# Patient Record
Sex: Female | Born: 1970 | Hispanic: No | Marital: Married | Smoking: Never smoker
Health system: Southern US, Community
[De-identification: ages and names within clinical notes are randomized; demographics above are authoritative.]

## PROBLEM LIST (undated history)

## (undated) DIAGNOSIS — K219 Gastro-esophageal reflux disease without esophagitis: Secondary | ICD-10-CM

## (undated) DIAGNOSIS — D509 Iron deficiency anemia, unspecified: Secondary | ICD-10-CM

## (undated) HISTORY — PX: APPENDECTOMY: SHX54

---

## 2002-10-02 ENCOUNTER — Other Ambulatory Visit: Admission: RE | Admit: 2002-10-02 | Discharge: 2002-10-02 | Payer: Self-pay | Admitting: Obstetrics and Gynecology

## 2003-04-12 ENCOUNTER — Inpatient Hospital Stay (HOSPITAL_COMMUNITY): Admission: AD | Admit: 2003-04-12 | Discharge: 2003-04-15 | Payer: Self-pay | Admitting: *Deleted

## 2003-08-13 ENCOUNTER — Inpatient Hospital Stay (HOSPITAL_COMMUNITY): Admission: AD | Admit: 2003-08-13 | Discharge: 2003-08-13 | Payer: Self-pay | Admitting: Obstetrics and Gynecology

## 2003-08-17 ENCOUNTER — Inpatient Hospital Stay (HOSPITAL_COMMUNITY): Admission: AD | Admit: 2003-08-17 | Discharge: 2003-08-17 | Payer: Self-pay | Admitting: Obstetrics and Gynecology

## 2003-11-28 ENCOUNTER — Encounter: Admission: RE | Admit: 2003-11-28 | Discharge: 2003-11-28 | Payer: Self-pay | Admitting: Family Medicine

## 2004-01-07 ENCOUNTER — Ambulatory Visit (HOSPITAL_COMMUNITY): Admission: RE | Admit: 2004-01-07 | Discharge: 2004-01-07 | Payer: Self-pay | Admitting: Obstetrics & Gynecology

## 2004-01-27 ENCOUNTER — Ambulatory Visit (HOSPITAL_COMMUNITY): Admission: RE | Admit: 2004-01-27 | Discharge: 2004-01-27 | Payer: Self-pay | Admitting: Obstetrics & Gynecology

## 2005-03-22 ENCOUNTER — Emergency Department (HOSPITAL_COMMUNITY): Admission: EM | Admit: 2005-03-22 | Discharge: 2005-03-22 | Payer: Self-pay | Admitting: Emergency Medicine

## 2005-03-27 ENCOUNTER — Emergency Department (HOSPITAL_COMMUNITY): Admission: EM | Admit: 2005-03-27 | Discharge: 2005-03-27 | Payer: Self-pay | Admitting: Family Medicine

## 2009-08-13 ENCOUNTER — Encounter: Admission: RE | Admit: 2009-08-13 | Discharge: 2009-08-13 | Payer: Self-pay | Admitting: Otolaryngology

## 2010-01-04 ENCOUNTER — Encounter: Admission: RE | Admit: 2010-01-04 | Discharge: 2010-01-04 | Payer: Self-pay | Admitting: Family Medicine

## 2010-01-04 ENCOUNTER — Encounter (INDEPENDENT_AMBULATORY_CARE_PROVIDER_SITE_OTHER): Payer: Self-pay | Admitting: *Deleted

## 2010-01-18 ENCOUNTER — Encounter (INDEPENDENT_AMBULATORY_CARE_PROVIDER_SITE_OTHER): Payer: Self-pay | Admitting: *Deleted

## 2010-02-05 ENCOUNTER — Ambulatory Visit: Payer: Self-pay | Admitting: Gastroenterology

## 2010-02-05 ENCOUNTER — Encounter (INDEPENDENT_AMBULATORY_CARE_PROVIDER_SITE_OTHER): Payer: Self-pay | Admitting: *Deleted

## 2010-02-05 DIAGNOSIS — R1013 Epigastric pain: Secondary | ICD-10-CM | POA: Insufficient documentation

## 2010-03-23 ENCOUNTER — Emergency Department (HOSPITAL_COMMUNITY)
Admission: EM | Admit: 2010-03-23 | Discharge: 2010-03-23 | Payer: Self-pay | Source: Home / Self Care | Admitting: Emergency Medicine

## 2010-03-29 ENCOUNTER — Ambulatory Visit: Payer: Self-pay | Admitting: Gastroenterology

## 2010-03-30 ENCOUNTER — Telehealth: Payer: Self-pay | Admitting: Gastroenterology

## 2010-03-30 ENCOUNTER — Telehealth (INDEPENDENT_AMBULATORY_CARE_PROVIDER_SITE_OTHER): Payer: Self-pay | Admitting: *Deleted

## 2010-03-31 ENCOUNTER — Ambulatory Visit: Payer: Self-pay | Admitting: Cardiology

## 2010-03-31 ENCOUNTER — Ambulatory Visit: Payer: Self-pay | Admitting: Internal Medicine

## 2010-03-31 ENCOUNTER — Ambulatory Visit: Payer: Self-pay | Admitting: Gastroenterology

## 2010-03-31 ENCOUNTER — Encounter: Payer: Self-pay | Admitting: Physician Assistant

## 2010-03-31 DIAGNOSIS — R634 Abnormal weight loss: Secondary | ICD-10-CM

## 2010-03-31 DIAGNOSIS — K2990 Gastroduodenitis, unspecified, without bleeding: Secondary | ICD-10-CM

## 2010-03-31 DIAGNOSIS — F411 Generalized anxiety disorder: Secondary | ICD-10-CM | POA: Insufficient documentation

## 2010-03-31 DIAGNOSIS — D509 Iron deficiency anemia, unspecified: Secondary | ICD-10-CM

## 2010-03-31 DIAGNOSIS — R1011 Right upper quadrant pain: Secondary | ICD-10-CM

## 2010-03-31 DIAGNOSIS — K2970 Gastritis, unspecified, without bleeding: Secondary | ICD-10-CM | POA: Insufficient documentation

## 2010-03-31 DIAGNOSIS — R110 Nausea: Secondary | ICD-10-CM

## 2010-03-31 DIAGNOSIS — R1031 Right lower quadrant pain: Secondary | ICD-10-CM

## 2010-03-31 LAB — CONVERTED CEMR LAB
ALT: 15 units/L (ref 0–35)
AST: 16 units/L (ref 0–37)
Albumin: 3.8 g/dL (ref 3.5–5.2)
Alkaline Phosphatase: 82 units/L (ref 39–117)
BUN: 14 mg/dL (ref 6–23)
Basophils Absolute: 0.1 10*3/uL (ref 0.0–0.1)
Basophils Relative: 0.7 % (ref 0.0–3.0)
CO2: 27 meq/L (ref 19–32)
Calcium: 9.3 mg/dL (ref 8.4–10.5)
Chloride: 105 meq/L (ref 96–112)
Creatinine, Ser: 0.7 mg/dL (ref 0.4–1.2)
Eosinophils Absolute: 0.3 10*3/uL (ref 0.0–0.7)
Eosinophils Relative: 4.3 % (ref 0.0–5.0)
GFR calc non Af Amer: 91.65 mL/min (ref >60)
Glucose, Bld: 81 mg/dL (ref 70–99)
HCT: 35.7 % — ABNORMAL LOW (ref 36.0–46.0)
Hemoglobin: 12.2 g/dL (ref 12.0–15.0)
Lymphocytes Relative: 26.5 % (ref 12.0–46.0)
Lymphs Abs: 2 10*3/uL (ref 0.7–4.0)
MCHC: 34 g/dL (ref 30.0–36.0)
MCV: 86.8 fL (ref 78.0–100.0)
Monocytes Absolute: 0.6 10*3/uL (ref 0.1–1.0)
Monocytes Relative: 7.3 % (ref 3.0–12.0)
Neutro Abs: 4.6 10*3/uL (ref 1.4–7.7)
Neutrophils Relative %: 61.2 % (ref 43.0–77.0)
Platelets: 370 10*3/uL (ref 150.0–400.0)
Potassium: 4.2 meq/L (ref 3.5–5.1)
RBC: 4.12 M/uL (ref 3.87–5.11)
RDW: 14.9 % — ABNORMAL HIGH (ref 11.5–14.6)
Sodium: 139 meq/L (ref 135–145)
Total Bilirubin: 0.7 mg/dL (ref 0.3–1.2)
Total Protein: 6.8 g/dL (ref 6.0–8.3)
WBC: 7.5 10*3/uL (ref 4.5–10.5)

## 2010-04-01 ENCOUNTER — Encounter: Payer: Self-pay | Admitting: Gastroenterology

## 2010-04-05 ENCOUNTER — Ambulatory Visit (HOSPITAL_COMMUNITY): Admission: RE | Admit: 2010-04-05 | Discharge: 2010-04-05 | Payer: Self-pay | Admitting: Gastroenterology

## 2010-04-05 ENCOUNTER — Ambulatory Visit: Payer: Self-pay | Admitting: Gastroenterology

## 2010-04-05 ENCOUNTER — Telehealth: Payer: Self-pay | Admitting: Physician Assistant

## 2010-04-05 LAB — CONVERTED CEMR LAB
CRP, High Sensitivity: 16.5 — ABNORMAL HIGH (ref 0.00–5.00)
IgA: 163 mg/dL (ref 68–378)

## 2010-04-09 LAB — CONVERTED CEMR LAB: Tissue Transglutaminase Ab, IgA: 4.7 units (ref <20)

## 2010-06-29 NOTE — Miscellaneous (Signed)
Summary: rx  Clinical Lists Changes  Medications: Changed medication from DEXILANT 60 MG CPDR (DEXLANSOPRAZOLE) 1 by mouth once daily to DEXILANT 60 MG CPDR (DEXLANSOPRAZOLE) 1 by mouth once daily 20-30 min before breakfast meal - Signed Rx of DEXILANT 60 MG CPDR (DEXLANSOPRAZOLE) 1 by mouth once daily 20-30 min before breakfast meal;  #30 x 11;  Signed;  Entered by: Rachael Fee MD;  Authorized by: Rachael Fee MD;  Method used: Print then Give to Patient    Prescriptions: DEXILANT 60 MG CPDR (DEXLANSOPRAZOLE) 1 by mouth once daily 20-30 min before breakfast meal  #30 x 11   Entered and Authorized by:   Rachael Fee MD   Signed by:   Rachael Fee MD on 03/29/2010   Method used:   Print then Give to Patient   RxID:   [id number]

## 2010-06-29 NOTE — Miscellaneous (Signed)
Summary: Orders Update/US  Clinical Lists Changes  Orders: Added new Test order of Ultrasound Abdomen (UAS) - Signed 

## 2010-06-29 NOTE — Progress Notes (Signed)
Summary: ON CALL NOTE  Phone Note Call from Patient   Caller: Spouse Summary of Call: husband called tonight at 7pm on call significant language barrier on phone.  Kinzlee has continued abdominal pain.  nausea, no vomiting.  No fevers or tenderness.  I cannot tell if the pain is worse since procedure or same as prior to procedure.  I told her husband if he is very worried about her, she needs to go to ER.  Otherwise, my office will call tomorrow in early AM [phone]) to get her an office apt with PA tomorrow.  She needs CBC, cmet an hour prior to that apt. Initial call taken by: Rachael Fee MD,  March 30, 2010 7:03 PM  Follow-up for Phone Call        pt returned call and spoke with Lady Gary who translated to her that she should be scheduled for an appt today with Amy Esterwood.  She also informed her to have labs 1 hour prior to appt.   Follow-up by: Chales Abrahams CMA Duncan Dull),  March 31, 2010 8:50 AM

## 2010-06-29 NOTE — Letter (Signed)
Summary: Out of Work  [facility]  [address] [place], [place] [address]   Phone: [phone]  Fax: [phone]    March 31, 2010   Employee:  HAEVEN NICKLE    To Whom It May Concern:   For Medical reasons, please excuse the above named employee from work for the following dates:  Start:   03-29-2010  End:   04-04-2010  If you need additional information, please feel free to contact our office.         Sincerely,      Amy Esterwood PA-C

## 2010-06-29 NOTE — Assessment & Plan Note (Signed)
History of Present Illness Visit Type: Initial Consult Primary GI MD: Rob Bunting MD Primary Provider: Maryelizabeth Rowan, MD Requesting Provider: Maryelizabeth Rowan, MD Chief Complaint: reflux and iron deficiency anemia hx of H-Pylori History of Present Illness:     one of our staff acting as interpreter  very pleasant Spanish speaking only 40 year old woman who has had abd discomfort for several months.  Epigastric pain, constant.  Eating makes it worse.  She gets pyrosis daily.  She was taking PPI (omeprazole) but stopped 2 weeks ago. While taking it, she was worse (bloating and worse pyrosis).  Overall her weight is up in th epast year.    She was taking iron, but stopped because it made her nauseas.  Eats chicken every day, but no red meat.   She ibuprofen 2-4 in a week for headaches.    labs in the pastHe does have shown that she is iron deficient. With a ferritin of 8. CBC late July showed hemoglobin 11.2, normocytic, elevated RDW. She was found to be H. pylori breath test positive and was treated with antibiotics this past summer. Other labs including a complete metabolic profile, thyroid testing were all normal.           Current Medications (verified): 1)  Cymbalta 30 Mg Cpep (Duloxetine Hcl) .... One Tablet By Mouth Once Daily 2)  Ferrous Sulfate 325 (65 Fe) Mg Tabs (Ferrous Sulfate) .... One Tablet By Mouth Once Daily 3)  Maalox 600 Mg Chew (Calcium Carbonate Antacid) .... As Needed 4)  Multivitamins   Tabs (Multiple Vitamin) .... One Tablet By Mouth Once Daily 5)  Omeprazole 40 Mg Cpdr (Omeprazole) .... One Capsule By Mouth Once Daily  Allergies (verified): No Known Drug Allergies  Past History:  Past Medical History: anemia Anxiety Arthritis Chronic headaches Depression  Past Surgical History: none  Family History: diabetes  Social History: she is married, she has 3 sons, she works in Education officer, environmental, she does not smoke cigarettes or drink alcohol.  Review  of Systems       Pertinent positive and negative review of systems were noted in the above HPI and GI specific review of systems.  All other review of systems was otherwise negative.   Vital Signs:  Patient profile:   39 year old female Height:      63 inches Weight:      174 pounds BMI:     30.93 Pulse rate:   84 / minute Pulse rhythm:   regular BP sitting:   112 / 72  (left arm)  Vitals Entered By: Milford Cage NCMA (February 05, 2010 3:00 PM)  Physical Exam  Additional Exam:  Constitutional: generally well appearing Psychiatric: alert and oriented times 3 Eyes: extraocular movements intact Mouth: oropharynx moist, no lesions Neck: supple, no lymphadenopathy Cardiovascular: heart regular rate and rythm Lungs: CTA bilaterally Abdomen: soft, non-tender, non-distended, no obvious ascites, no peritoneal signs, normal bowel sounds Extremities: no lower extremity edema bilaterally Skin: no lesions on visible extremities    Impression & Recommendations:  Problem # 1:  Epigastric discomfort, dyspepsia, GERD, iron deficiency perhaps this is recurrent H. pylori infection. Possibly peptic ulcer disease related to NSAIDs. Perhaps just GERD or gastritis. We will plan for EGD at her soonest convenience. I have given her samples of dexilant and asked her to take it once a day. She stopped taking omeprazole as well as iron supplements herself. I told her she should at least take multivitamin once daily containing iron for now.  Patient Instructions: 1)  You will be scheduled to have an upper endoscopy. 2)  Dexilant samples given, take one pill once a day. 3)  A copy of this information will be sent to Dr. Duanne Guess. 4)  Restart the Mutlivitamin once a day. 5)  The medication list was reviewed and reconciled.  All changed / newly prescribed medications were explained.  A complete medication list was provided to the patient / caregiver.  Appended Document: Orders Update/EGD    Clinical  Lists Changes  Problems: Added new problem of ABDOMINAL PAIN, EPIGASTRIC (ICD-789.06) Medications: Added new medication of DEXILANT 60 MG CPDR (DEXLANSOPRAZOLE) 1 by mouth once daily Orders: Added new Test order of EGD (EGD) - Signed

## 2010-06-29 NOTE — Letter (Signed)
Summary: EGD Instructions  [facility]  [address] [place], [place] [address]   Phone: [phone]  Fax: [phone]       Vanessa Richmond    10/18/1970    MRN: [id number]       Procedure Day /Date:03/23/10  TUE     Arrival Time: 1230 pm     Procedure Time:130 pm    Location of Procedure:                    [facility] (4th Floor)    PREPARATION FOR ENDOSCOPY   On 03/23/10  THE DAY OF THE PROCEDURE:  1.   No solid foods, milk or milk products are allowed after midnight the night before your procedure.  2.   Do not drink anything colored red or purple.  Avoid juices with pulp.  No orange juice.  3.  You may drink clear liquids until1130 am, which is 2 hours before your procedure.                                                                                                CLEAR LIQUIDS INCLUDE: Water Jello Ice Popsicles Tea (sugar ok, no milk/cream) Powdered fruit flavored drinks Coffee (sugar ok, no milk/cream) Gatorade Juice: apple, white grape, white cranberry  Lemonade Clear bullion, consomm, broth Carbonated beverages (any kind) Strained chicken noodle soup Hard Candy   MEDICATION INSTRUCTIONS  Unless otherwise instructed, you should take regular prescription medications with a small sip of water as early as possible the morning of your procedure.            OTHER INSTRUCTIONS  You will need a responsible adult at least 42 years of age to accompany you and drive you home.   This person must remain in the waiting room during your procedure.  Wear loose fitting clothing that is easily removed.  Leave jewelry and other valuables at home.  However, you may wish to bring a book to read or an iPod/MP3 player to listen to music as you wait for your procedure to start.  Remove all body piercing jewelry and leave at home.  Total time from sign-in until discharge is approximately 2-3 hours.  You should go home directly after  your procedure and rest.  You can resume normal activities the day after your procedure.  The day of your procedure you should not:   Drive   Make legal decisions   Operate machinery   Drink alcohol   Return to work  You will receive specific instructions about eating, activities and medications before you leave.    The above instructions have been reviewed and explained to me by   _______________________    I fully understand and can verbalize these instructions _____________________________ Date _________

## 2010-06-29 NOTE — Progress Notes (Signed)
Summary: Triage--Epigastric pain after procedure  Phone Note Outgoing Call Call back at [facility] Phone [phone]   Call placed by: Chales Abrahams CMA Duncan Dull),  March 30, 2010 8:39 AM Summary of Call: Pt called the endoscopy unit and complained of epigastric pain.  Dr Christella Hartigan would like to see  the pt today and have labs ( cbc, cmet) and xray ( plain flat-upright) before the appt.    There was no answer at the given number message was left. Initial call taken by: Chales Abrahams CMA Duncan Dull),  March 30, 2010 8:42 AM  Follow-up for Phone Call        left message on machine to call back Chales Abrahams CMA Duncan Dull)  March 30, 2010 1:23 PM   I have tried to call pt several times and have been unable to reach her.  Dr Christella Hartigan do you want me to send her a letter? Follow-up by: Chales Abrahams CMA Duncan Dull),  March 30, 2010 4:48 PM

## 2010-06-29 NOTE — Letter (Signed)
Summary: New Patient letter  [facility]  [address] [place], [place] [address]   Phone: [phone]  Fax: [phone]       01/18/2012 MRN: [id number]  Vanessa Richmond [address] [place], [place]  [address]  Dear Vanessa Richmond,  Welcome to the Gastroenterology Division at [facility].    You are scheduled to see Dr.  Rob Bunting on February 05, 2010 at 3:00pm on the 3rd floor at [facility], [address]  We ask that you try to arrive at our office 15 minutes prior to your appointment time to allow for check-in.  We would like you to complete the enclosed self-administered evaluation form prior to your visit and bring it with you on the day of your appointment.  We will review it with you.  Also, please bring a complete list of all your medications or, if you prefer, bring the medication bottles and we will list them.  Please bring your insurance card so that we may make a copy of it.  If your insurance requires a referral to see a specialist, please bring your referral form from your primary care physician.  Co-payments are due at the time of your visit and may be paid by cash, check or credit card.     Your office visit will consist of a consult with your physician (includes a physical exam), any laboratory testing he/she may order, scheduling of any necessary diagnostic testing (e.g. x-ray, ultrasound, CT-scan), and scheduling of a procedure (e.g. Endoscopy, Colonoscopy) if required.  Please allow enough time on your schedule to allow for any/all of these possibilities.    If you cannot keep your appointment, please call [phone] to cancel or reschedule prior to your appointment date.  This allows [place] the opportunity to schedule an appointment for another patient in need of care.  If you do not cancel or reschedule by 5 p.m. the business day prior to your appointment date, you will be charged a $50.00 late cancellation/no-show fee.    Thank you for  choosing [facility] for your medical needs.  We appreciate the opportunity to care for you.  Please visit [place] at our website  to learn more about our practice.                     Sincerely,                                                             The Gastroenterology Division

## 2010-06-29 NOTE — Procedures (Signed)
Summary: Upper Endoscopy  Patient: [name] Note: All result statuses are Final unless otherwise noted.  Tests: (1) Upper Endoscopy (EGD)   EGD Upper Endoscopy       DONE     [facility]     [address]     [place], [place]  [address]           ENDOSCOPY PROCEDURE REPORT           PATIENT:  Vanessa, Richmond  MR#:  [id number]     BIRTHDATE:  11-Jun-1970, 39 yrs. old  GENDER:  female     ENDOSCOPIST:  Rachael Fee, MD     Referred by:  Maryelizabeth Rowan, M.D.     PROCEDURE DATE:  03/29/2010     PROCEDURE:  EGD with biopsy, [id number]     ASA CLASS:  Class II     INDICATIONS:  GERD, dyspepsia, h/o H. pylori     MEDICATIONS:  Fentanyl 50 mcg IV, Versed 7 mg IV     TOPICAL ANESTHETIC:  Exactacain Spray           DESCRIPTION OF PROCEDURE:   After the risks benefits and     alternatives of the procedure were thoroughly explained, informed     consent was obtained.  The LB GIF-H180 [id number] endoscope was     introduced through the mouth and advanced to the second portion of     the duodenum, without limitations.  The instrument was slowly     withdrawn as the mucosa was fully examined.     <<PROCEDUREIMAGES>>     There was mild, erosive esophagitis at GE junction (see image2).     Mild gastritis was found, biopsied (see image3).  Otherwise the     examination was normal (see image6, image5, and image4).     Retroflexed views revealed no abnormalities.    The scope was then     withdrawn from the patient and the procedure completed.     COMPLICATIONS:  None           ENDOSCOPIC IMPRESSION:     1) Mild, erosive reflux esophagitis     2) Mild gastritis; biopsied taken to check for recurrent H.     pylori.     3) Otherwise normal examination           RECOMMENDATIONS:     New prescription for dexilant sent in.  Take one pill, daily     20-30 min prior to BF meal.     If the biopsies show recurrent H. pylori, she will be started on     the appropriate antibiotics.        ______________________________     Rachael Fee, MD           n.     eSIGNED:   Rachael Fee at 03/29/2010 09:21 AM           Gaspar Bidding, [id number]  Note: An exclamation mark (!) indicates a result that was not dispersed into the flowsheet. Document Creation Date: 03/29/2010 9:21 AM _______________________________________________________________________  (1) Order result status: Final Collection or observation date-time: 03/29/2010 09:13 Requested date-time:  Receipt date-time:  Reported date-time:  Referring Physician:   Ordering Physician: Rob Bunting [phone]) Specimen Source:  Source: Launa Grill Order Number: [id number] Lab site:

## 2010-06-29 NOTE — Assessment & Plan Note (Signed)
Summary: ABD PAIN/YF   History of Present Illness Primary GI MD: Rob Bunting MD Primary Provider: Dr Maryelizabeth Rowan Requesting Provider: Maryelizabeth Rowan, MD Chief Complaint: Patient c/o epgiastric abdominal pain worse after EGD. There is nausea and vomitng as well as abdominal bloating. Patient states that she has had a fever as well. History of Present Illness:   PLEASANT 40 YO HISPANIC FEMALE KNOWN RECENTLY TO DR. Christella Hartigan. SHE SPEAKS VERY LITTLE ENGLISH AND INTERVIEW IS DONE WITH AN INTERPRETOR. SHE HAS C/O ONGOING UPPER ABDOMINAL PAIN OVER THE PAST 3 MONTHS WHICH IS CONSTANT. SHE DESCRIBES IT AS PRESSURE AND THROBBING,WITH RADIATION THRU TO HER BACK. SHE HAS NAUSEA,NO VOMITING. THE PAIN  IS ALWAYS WORSE AFTER EATING,AND SHE SAYS SHE IS AFRAID TO EAT. WEIGHT IS DOWN  A FEW POUNDS. SHE REPORTS INTERMITTENT FEVER AT HOME  ?100.5  RANGE. HER BM'S ALTERNATE SOME, NOTES OCCASIONAL BLOOD IF CONSTIPATED. SHE UNDERWENT EGD ON 10/31 WHICH SHOWED MILD ESOPHAGITIS,MILD GASTRITIS. BX NEGATIVE FOR H.PYLORI. SHE IS TAKING DEXILANT 60 MG DAILY. SHE FEELS HER PAIN IS GETTING WORSE. LABS DONE TODAY ARE PENDING   GI Review of Systems    Reports abdominal pain, belching, bloating, heartburn, loss of appetite, nausea, and  vomiting.      Denies acid reflux, chest pain, dysphagia with liquids, dysphagia with solids, vomiting blood, weight loss, and  weight gain.      Reports change in bowel habits, constipation, diarrhea, and  rectal bleeding.     Denies anal fissure, black tarry stools, diverticulosis, fecal incontinence, heme positive stool, hemorrhoids, irritable bowel syndrome, jaundice, light color stool, liver problems, and  rectal pain.    Current Medications (verified): 1)  Cymbalta 30 Mg Cpep (Duloxetine Hcl) .... One Tablet By Mouth Once Daily 2)  Maalox 600 Mg Chew (Calcium Carbonate Antacid) .... As Needed 3)  Dexilant 60 Mg Cpdr (Dexlansoprazole) .Marland Kitchen.. 1 By Mouth Once Daily 20-30 Min Before  Breakfast Meal  Allergies (verified): No Known Drug Allergies  Past History:  Past Medical History: FE DEFICIENCY ANEMIA Anxiety Arthritis Chronic headaches Depression  Past Surgical History: Tubal Ligation EGD 10/11-MILD ESOPHAGITIS/GASTRITIS (JACOBS)  Family History: diabetes: Mother Family History of Heart Disease: Mother  Social History: Reviewed history from 02/05/2010 and no changes required. she is married, she has 3 sons, she works in Education officer, environmental, she does not smoke cigarettes or drink alcohol.  Review of Systems       The patient complains of fatigue.  The patient denies allergy/sinus, anemia, anxiety-new, arthritis/joint pain, back pain, blood in urine, breast changes/lumps, change in vision, confusion, cough, coughing up blood, depression-new, fainting, headaches-new, hearing problems, heart murmur, heart rhythm changes, itching, menstrual pain, muscle pains/cramps, night sweats, nosebleeds, pregnancy symptoms, shortness of breath, skin rash, sleeping problems, sore throat, swelling of feet/legs, swollen lymph glands, thirst - excessive, urination - excessive, urination changes/pain, urine leakage, vision changes, and voice change.         SEE HPI  Vital Signs:  Patient profile:   40 year old female Height:      63 inches Weight:      172 pounds BMI:     30.58 BSA:     1.82 Temp:     98.7 degrees F Pulse rate:   72 / minute Pulse rhythm:   regular BP sitting:   106 / 68  (left arm)  Vitals Entered By: Lamona Curl CMA Duncan Dull) (March 31, 2010 10:15 AM)  Physical Exam  General:  Well developed, well nourished, no acute  distress. Head:  Normocephalic and atraumatic. Eyes:  PERRLA, no icterus. Neck:  Supple; no masses or thyromegaly. Lungs:  Clear throughout to auscultation. Heart:  Regular rate and rhythm; no murmurs, rubs,  or bruits. Abdomen:  SOFT, TENDER RUQ/RLQ, NO MASS OR HSM, NO GUARDING OR REBOUND,BS+ Rectal:  HEME  NEGATIVE  STOOL. Extremities:  No clubbing, cyanosis, edema or deformities noted. Neurologic:  Alert and  oriented x4;  grossly normal neurologically. Psych:  Alert and cooperative. Normal mood and affect.   Impression & Recommendations:  Problem # 1:  ABDOMINAL PAIN-RUQ (ICD-789.01) Assessment Deteriorated 40 YO HISPANIC FEMALE  WITH 3 MONTH HX OF UPPER ABDOMINAL PAIN,NAUSEA. EGD 03/29/10 WITH MILD GASTRITIS/ESOPHAGITIS-UNCERTAIN THAT THESE FINDINGS EXPLAIN THE DEGREE OF SXS SHE IS HAVING.  ALSO QIUTE TENDER RLQ ON EXAM. R/O GB DISEASE/IBD/OCCULT LESION.  CONTINUE DEXILANT 60 MG DAILY SCHEDULE FOR CT ABD/PELVIS  PT ASKS FOR NOTE FOR WORK-WILL COVER THRU 11/6 TYLENOL  AS NEEDED FOR PAIN LABS FROM TODAY REVIEWED-HGB 12.2,WBC,CMET ALL NORMAL  Problem # 2:  ANEMIA-IRON DEFICIENCY (ICD-280.9) Assessment: Comment Only HEME NEGATIVE, ETILOGY NOT CLEAR,MAY BE MENSTRUAL LOSSES.  Problem # 3:  ANXIETY (ICD-300.00) Assessment: Comment Only  Other Orders: CT Abdomen/Pelvis with Contrast (CT Abd/Pelvis w/con)  Patient Instructions: 1)  Please go to [address] [place]  2)  Directions and brochure provided. 3)  Continue the Dexilant daily. 4)  Take Tylenol 1-2 every 8 hours as needed for pain. 5)  We have given you a note for work. 6)  Copy sent to : Dr. Maryelizabeth Rowan 7)  We will call you with the CT scan results. 8)  The medication list was reviewed and reconciled.  All changed / newly prescribed medications were explained.  A complete medication list was provided to the patient / caregiver.

## 2010-06-29 NOTE — Progress Notes (Signed)
Summary: Patient had ultrasound this am  Phone Note Call from Patient   Call For: Mike Gip, PA Reason for Call: Talk to Nurse Summary of Call: Patient went for ultrasound today and wonders how soon she would hear back on her results and when she should follow up? And wonders if she can stay off work one more week? Initial call taken by: Leanor Kail [facility],  April 05, 2010 9:54 AM  Follow-up for Phone Call        I spoke to the pt in our office at the desk of Ursula Beath who translated for me.  The pt is still having pain.  She says it is constant and at an 8.  She is worried about her job.  She says it is worse for her if she goes to work and they have to send her home.  It is better if she doesn't goes to work and calls in.  We gave her a work note to return this evening 04-05-10.  I left a message for Amy to call me and explained what the pt's complaints were today.  I also sent her to the lab for the tests Dr Christella Hartigan, and Dr Leone Payor agreed she should have.  Follow-up by: Joselyn Glassman,  April 05, 2010 10:21 AM  Additional Follow-up for Phone Call Additional follow up Details #1::        see my note today on her [place] report. Additional Follow-up by: Rachael Fee MD,  April 05, 2010 10:44 AM     Appended Document: Patient had ultrasound this am DEFER TO DR. Christella Hartigan .

## 2010-08-11 LAB — URINALYSIS, ROUTINE W REFLEX MICROSCOPIC
Bilirubin Urine: NEGATIVE
Glucose, UA: NEGATIVE mg/dL
Hgb urine dipstick: NEGATIVE
Ketones, ur: NEGATIVE mg/dL
Nitrite: NEGATIVE
Protein, ur: NEGATIVE mg/dL
Specific Gravity, Urine: 1.01 (ref 1.005–1.030)
Urobilinogen, UA: 0.2 mg/dL (ref 0.0–1.0)
pH: 6 (ref 5.0–8.0)

## 2010-08-11 LAB — CBC
HCT: 34 % — ABNORMAL LOW (ref 36.0–46.0)
Hemoglobin: 11.4 g/dL — ABNORMAL LOW (ref 12.0–15.0)
MCH: 29.2 pg (ref 26.0–34.0)
MCHC: 33.6 g/dL (ref 30.0–36.0)
MCV: 86.8 fL (ref 78.0–100.0)
Platelets: 330 10*3/uL (ref 150–400)
RBC: 3.92 MIL/uL (ref 3.87–5.11)
RDW: 14.3 % (ref 11.5–15.5)
WBC: 8.7 10*3/uL (ref 4.0–10.5)

## 2010-08-11 LAB — COMPREHENSIVE METABOLIC PANEL
ALT: 15 U/L (ref 0–35)
AST: 18 U/L (ref 0–37)
Albumin: 3.8 g/dL (ref 3.5–5.2)
Alkaline Phosphatase: 81 U/L (ref 39–117)
BUN: 13 mg/dL (ref 6–23)
CO2: 25 mEq/L (ref 19–32)
Calcium: 9.1 mg/dL (ref 8.4–10.5)
Chloride: 108 mEq/L (ref 96–112)
Creatinine, Ser: 0.79 mg/dL (ref 0.4–1.2)
GFR calc Af Amer: >60 mL/min (ref >60)
GFR calc non Af Amer: >60 mL/min (ref >60)
Glucose, Bld: 105 mg/dL — ABNORMAL HIGH (ref 70–99)
Potassium: 4 mEq/L (ref 3.5–5.1)
Sodium: 138 mEq/L (ref 135–145)
Total Bilirubin: <0.1 mg/dL — ABNORMAL LOW (ref 0.3–1.2)
Total Protein: 6.8 g/dL (ref 6.0–8.3)

## 2010-08-11 LAB — DIFFERENTIAL
Basophils Absolute: 0.1 10*3/uL (ref 0.0–0.1)
Basophils Relative: 1 % (ref 0–1)
Eosinophils Absolute: 0.2 10*3/uL (ref 0.0–0.7)
Eosinophils Relative: 3 % (ref 0–5)
Lymphocytes Relative: 18 % (ref 12–46)
Lymphs Abs: 1.5 10*3/uL (ref 0.7–4.0)
Monocytes Absolute: 0.5 10*3/uL (ref 0.1–1.0)
Monocytes Relative: 6 % (ref 3–12)
Neutro Abs: 6.3 10*3/uL (ref 1.7–7.7)
Neutrophils Relative %: 73 % (ref 43–77)

## 2010-08-11 LAB — POCT PREGNANCY, URINE: Preg Test, Ur: NEGATIVE

## 2010-08-11 LAB — LIPASE, BLOOD: Lipase: 34 U/L (ref 11–59)

## 2010-08-17 ENCOUNTER — Emergency Department (HOSPITAL_COMMUNITY): Payer: 59

## 2010-08-17 ENCOUNTER — Emergency Department (HOSPITAL_COMMUNITY)
Admission: EM | Admit: 2010-08-17 | Discharge: 2010-08-17 | Disposition: A | Discharge status: Home / Self Care | Payer: 59 | Attending: Emergency Medicine | Admitting: Emergency Medicine

## 2010-08-17 DIAGNOSIS — R5381 Other malaise: Secondary | ICD-10-CM | POA: Insufficient documentation

## 2010-08-17 DIAGNOSIS — R55 Syncope and collapse: Secondary | ICD-10-CM | POA: Insufficient documentation

## 2010-08-17 DIAGNOSIS — R5383 Other fatigue: Secondary | ICD-10-CM | POA: Insufficient documentation

## 2010-08-17 DIAGNOSIS — R059 Cough, unspecified: Secondary | ICD-10-CM | POA: Insufficient documentation

## 2010-08-17 DIAGNOSIS — R05 Cough: Secondary | ICD-10-CM | POA: Insufficient documentation

## 2010-08-19 ENCOUNTER — Emergency Department (HOSPITAL_COMMUNITY): Payer: No Typology Code available for payment source

## 2010-08-19 ENCOUNTER — Emergency Department (HOSPITAL_COMMUNITY)
Admission: EM | Admit: 2010-08-19 | Discharge: 2010-08-20 | Disposition: A | Discharge status: Home / Self Care | Payer: No Typology Code available for payment source | Attending: Emergency Medicine | Admitting: Emergency Medicine

## 2010-08-19 DIAGNOSIS — M542 Cervicalgia: Secondary | ICD-10-CM | POA: Insufficient documentation

## 2010-08-19 DIAGNOSIS — R51 Headache: Secondary | ICD-10-CM | POA: Insufficient documentation

## 2010-08-19 DIAGNOSIS — M545 Low back pain, unspecified: Secondary | ICD-10-CM | POA: Insufficient documentation

## 2010-08-19 DIAGNOSIS — R110 Nausea: Secondary | ICD-10-CM | POA: Insufficient documentation

## 2010-08-19 DIAGNOSIS — H539 Unspecified visual disturbance: Secondary | ICD-10-CM | POA: Insufficient documentation

## 2010-08-19 DIAGNOSIS — R071 Chest pain on breathing: Secondary | ICD-10-CM | POA: Insufficient documentation

## 2010-08-19 DIAGNOSIS — R109 Unspecified abdominal pain: Secondary | ICD-10-CM | POA: Insufficient documentation

## 2010-08-19 DIAGNOSIS — S139XXA Sprain of joints and ligaments of unspecified parts of neck, initial encounter: Secondary | ICD-10-CM | POA: Insufficient documentation

## 2010-08-19 DIAGNOSIS — M546 Pain in thoracic spine: Secondary | ICD-10-CM | POA: Insufficient documentation

## 2010-08-20 ENCOUNTER — Emergency Department (HOSPITAL_COMMUNITY): Payer: No Typology Code available for payment source

## 2010-08-20 LAB — POCT I-STAT, CHEM 8
BUN: 11 mg/dL (ref 6–23)
Calcium, Ion: 1.11 mmol/L — ABNORMAL LOW (ref 1.12–1.32)
Creatinine, Ser: 0.9 mg/dL (ref 0.4–1.2)
TCO2: 24 mmol/L (ref 0–100)

## 2010-08-20 MED ORDER — IOHEXOL 300 MG/ML  SOLN
100.0000 mL | Freq: Once | INTRAMUSCULAR | Status: AC | PRN
  Administered 2010-08-20: 100 mL via INTRAVENOUS

## 2010-10-15 NOTE — H&P (Signed)
NAME:  Vanessa Richmond, Vanessa Richmond                            ACCOUNT NO.:  [id number]   MEDICAL RECORD NO.:  [id number]                   PATIENT TYPE:  AMB   LOCATION:  SDC                                  FACILITY:  [facility]   PHYSICIAN:  Roseanna Rainbow, M.D.         DATE OF BIRTH:  [birth year]   DATE OF ADMISSION:  DATE OF DISCHARGE:                                HISTORY & PHYSICAL   CHIEF COMPLAINT:  The patient is a 40 year old Latino female with subacute  right lower quadrant pain and who desires a sterilization procedure.   HISTORY OF PRESENT ILLNESS:  The patient has a several-month history of  right lower quadrant pain.  Previous workup has included serial pelvic  ultrasounds that were consistent with resolution of a likely right-sided  follicular cyst.   PAST OBSTETRICAL AND GYNECOLOGIC HISTORY:  See above.  She is status post  three NSVDs.   PAST MEDICAL HISTORY:  She gives a history of migraine headaches and a  history of depression and anxiety disorder.   PAST SURGICAL HISTORY:  She denies.   SOCIAL HISTORY:  She is married.  She denies any tobacco, ethanol, or  substance abuse.   FAMILY HISTORY:  Remarkable for myocardial infarction, diabetes mellitus,  and chronic hypertension.   ALLERGIES:  No known drug allergies.   MEDICATIONS:  None.   PHYSICAL EXAMINATION:  VITAL SIGNS:  Blood pressure 105/56, pulse 66,  temperature 98.3, weight 156 pounds.  GENERAL:  A well-developed, well-nourished Latino female in no apparent  distress.  NECK:  Supple.  CHEST:  Lungs clear to auscultation bilaterally.  CARDIAC:  Regular rate and rhythm.  ABDOMEN:  Soft, nontender, no organomegaly.  PELVIC:  BUS normal.  On speculum exam, the vagina is clean.  On bimanual  exam the uterus is anteverted, normal size, nontender.  The adnexa are  nonpalpable, nontender bilaterally.   ASSESSMENT:  1. Multipara, desires sterilization procedure.  2. Subacute right lower quadrant pain.   PLAN:   Laparoscopic bilateral tubal ligation with possible peritoneal  biopsies.  The risks, benefits, and alternative forms of management were  reviewed with the patient, including but not limited to a failure rate of  approximately four to eight per 1000 cases with the subsequent increased  risk of an ectopic pregnancy.                                               Roseanna Rainbow, M.D.    Vanessa Richmond  D:  01/11/2004  T:  01/11/2004  Job:  [id number]

## 2010-10-15 NOTE — Op Note (Signed)
NAME:  Vanessa Richmond, [name]                            ACCOUNT NO.:  [id number]   MEDICAL RECORD NO.:  [id number]                   PATIENT TYPE:  AMB   LOCATION:  SDC                                  FACILITY:  [facility]   PHYSICIAN:  Roseanna Rainbow, M.D.         DATE OF BIRTH:  1970/07/25   DATE OF PROCEDURE:  01/27/2004  DATE OF DISCHARGE:                                 OPERATIVE REPORT   PREOPERATIVE DIAGNOSES:  1.  Multiparity, desires sterilization procedure.  2.  Chronic pelvic pain.   POSTOPERATIVE DIAGNOSES:  1.  Multiparity, desires sterilization procedure.  2.  Chronic pelvic pain.   PROCEDURE:  Laparoscopic bilateral tubal ligation with bipolar cautery.   SURGEON:  Roseanna Rainbow, M.D.   ANESTHESIA:  General endotracheal.   ESTIMATED BLOOD LOSS:  Less than 50 mL.   COMPLICATIONS:  None.   FINDINGS:  A survey of the abdominopelvic viscera peritoneal surfaces was  essentially normal.  There were filmy adhesions involving the cecum to the  abdominal wall.   PROCEDURE:  The patient was taken to the operating room.  General anesthesia  was induced without difficulty.  She was placed in the dorsal lithotomy  position and prepped and draped in the usual sterile fashion.  A speculum  was placed in the vagina.  The anterior lip of the cervix was grasped with a  single-tooth tenaculum.  The Hulka with manipulator was then advanced into  the uterus and secured to the anterior lip of the cervix as a means to  manipulate the uterus.  The single-tooth tenaculum and speculum were then  removed.  An infraumbilical skin incision was then made with a scalpel.  The  Veress needle was then introduced into the peritoneal cavity at a 45 degree  angle while tenting up the anterior abdominal wall.  Intra-abdominal  placement was confirmed with an appropriate CO2 pressure level as well as a  saline drop test.  The abdomen was then insufflated with CO2 gas.  An 11 mm  trocar and sleeve  were then advanced into the abdomen, again where intra-  abdominal placement was confirmed by the laparoscope.  A survey of the  abdominopelvic viscera revealed the above findings.  The midisthmic portion  of the fallopian tubes were then cauterized contiguously bilaterally.  With  each application, the Ohm meter was noted to go to 0.  All the instruments  were then removed from the abdomen.  The skin was reapproximated with 3-0  Monocryl and a skin  adhesive.  The Hulka manipulator was removed from the cervix with minimal  bleeding noted.  At the close of the procedure the instrument and pad counts  were said to be correct x2.  The patient was taken to the PACU awake, in  stable condition.  Roseanna Rainbow, M.D.    Judee Clara  D:  01/27/2004  T:  01/27/2004  Job:  [id number]

## 2010-11-24 ENCOUNTER — Emergency Department (HOSPITAL_COMMUNITY): Payer: Worker's Compensation

## 2010-11-24 ENCOUNTER — Emergency Department (HOSPITAL_COMMUNITY)
Admission: EM | Admit: 2010-11-24 | Discharge: 2010-11-24 | Disposition: A | Discharge status: Home / Self Care | Payer: Worker's Compensation | Attending: Emergency Medicine | Admitting: Emergency Medicine

## 2010-11-24 DIAGNOSIS — F329 Major depressive disorder, single episode, unspecified: Secondary | ICD-10-CM | POA: Insufficient documentation

## 2010-11-24 DIAGNOSIS — Y9269 Other specified industrial and construction area as the place of occurrence of the external cause: Secondary | ICD-10-CM | POA: Insufficient documentation

## 2010-11-24 DIAGNOSIS — T5991XA Toxic effect of unspecified gases, fumes and vapors, accidental (unintentional), initial encounter: Secondary | ICD-10-CM | POA: Insufficient documentation

## 2010-11-24 DIAGNOSIS — K2970 Gastritis, unspecified, without bleeding: Secondary | ICD-10-CM | POA: Insufficient documentation

## 2010-11-24 DIAGNOSIS — F3289 Other specified depressive episodes: Secondary | ICD-10-CM | POA: Insufficient documentation

## 2010-11-24 DIAGNOSIS — T59891A Toxic effect of other specified gases, fumes and vapors, accidental (unintentional), initial encounter: Secondary | ICD-10-CM | POA: Insufficient documentation

## 2011-04-04 ENCOUNTER — Other Ambulatory Visit: Payer: Self-pay | Admitting: Gastroenterology

## 2011-06-07 ENCOUNTER — Ambulatory Visit (HOSPITAL_COMMUNITY)
Admission: RE | Admit: 2011-06-07 | Discharge: 2011-06-07 | Disposition: A | Discharge status: Home / Self Care | Payer: Worker's Compensation | Source: Ambulatory Visit | Attending: Preventative Medicine | Admitting: Preventative Medicine

## 2011-06-07 DIAGNOSIS — M546 Pain in thoracic spine: Secondary | ICD-10-CM | POA: Insufficient documentation

## 2011-06-07 DIAGNOSIS — R262 Difficulty in walking, not elsewhere classified: Secondary | ICD-10-CM | POA: Insufficient documentation

## 2011-06-07 DIAGNOSIS — M6281 Muscle weakness (generalized): Secondary | ICD-10-CM | POA: Insufficient documentation

## 2011-06-07 DIAGNOSIS — M545 Low back pain, unspecified: Secondary | ICD-10-CM | POA: Insufficient documentation

## 2011-06-07 DIAGNOSIS — M2560 Stiffness of unspecified joint, not elsewhere classified: Secondary | ICD-10-CM | POA: Insufficient documentation

## 2011-06-07 DIAGNOSIS — IMO0001 Reserved for inherently not codable concepts without codable children: Secondary | ICD-10-CM | POA: Insufficient documentation

## 2011-06-07 NOTE — Progress Notes (Signed)
Physical Therapy Evaluation  Patient Details  Name: CHIDINMA CLITES MRN: [id number] Date of Birth: Mar 27, 1971  Today's Date: 06/07/2011 Time: 1030-1125 Time Calculation (min): 55 min  Visit#: 1  of 3   Re-eval:   Assessment Diagnosis: back contusion Next MD Visit: 06/09/11 Prior Therapy: none  Past Medical History: No past medical history on file. Past Surgical History: No past surgical history on file.  Subjective Symptoms/Limitations Symptoms: Ms. Morris states that she was at work when she fell backwards .  She states that she is having increased pain from her thoracic area to her low back B.   How long can you sit comfortably?: The patient states that if she sits she has increased pain almost immediately. How long can you stand comfortably?: Pt states that standing is better but she feels she needs to sit after 15-20 minute. How long can you walk comfortably?: Pt states that she has increased pain after walking for ten minutes. Pain Assessment Currently in Pain?: Yes Pain Score:   8 Pain Location: Back Pain Orientation: Mid;Lower;Medial;Lateral;Right;Left Pain Type: Acute pain Pain Onset: In the past 7 days Pain Frequency: Constant    Prior Functioning  Prior Function Vocation: Full time employment Vocation Requirements: standing uses pressure washer; bending forward and reaching up   Assessment RUE Assessment RUE Assessment: Exceptions to [facility] but slow and painful with all motion) LUE AROM (degrees) Overall AROM Left Upper Extremity:  (wnl but slow and painful) Lumbar AROM Lumbar Flexion: decreased 40% with reps causing increased pain Lumbar Extension: decreased 50% reps increse pain Lumbar - Right Side Bend: decreased 20% Lumbar - Left Side Bend: decreased 20% Lumbar - Right Rotation: decreased 25% Lumbar - Left Rotation: wnl  Exercise/Treatments    Stretches Active Hamstring Stretch: 3 reps;30 seconds Single Knee to Chest Stretch: 3 reps;30  seconds Lower Trunk Rotation: 5 reps ITB Stretch:  (Supine Shld flex B; standing Shoulder Abduction B x 5)   Modalities Modalities: Moist Heat;Electrical Stimulation Moist Heat Therapy Number Minutes Moist Heat: 15 Minutes Moist Heat Location:  (Thoracic/lumbar area B) Electrical Stimulation Electrical Stimulation Location: IFES lower thoracic/lumbar area B Electrical Stimulation Parameters: 14 Electrical Stimulation Goals: Pain  Physical Therapy Assessment and Plan PT Assessment and Plan Clinical Impression Statement: Pt with mm contusion/strain who will benefit from skilled therapy to return pt to previous functional level Rehab Potential: Good PT Frequency: Min 3X/week PT Duration:  (one week per MD) PT Treatment/Interventions: Therapeutic activities;Other (comment) (modalities as needed for pain) PT Plan: begin functional squat; mad cat; standing SB exercises next treatment.    Goals Home Exercise Program Pt will Perform Home Exercise Program: Independently PT Short Term Goals Time to Complete Short Term Goals:  (3 days) PT Short Term Goal 1: Pt pain to be decreased by 50% PT Short Term Goal 2: Pt to have full shoulder flex without pain, Pt to be able to squat without pain  Problem List Patient Active Problem List  Diagnoses  . ANEMIA-IRON DEFICIENCY  . ANXIETY  . GASTRITIS  . WEIGHT LOSS  . NAUSEA ALONE  . ABDOMINAL PAIN-RUQ  . ABDOMINAL PAIN-RLQ  . ABDOMINAL PAIN, EPIGASTRIC  . Stiffness of joints, not elsewhere classified, multiple sites    PT - End of Session Activity Tolerance: Patient tolerated treatment well General Behavior During Session: [facility] for tasks performed Cognition: [facility] for tasks performed   RUSSELL,CINDY 06/07/2011, 12:12 PM  Physician Documentation Your signature is required to indicate approval of the treatment plan as stated above.  Please sign and either send electronically or make a copy of this report for your files and return this  physician signed original.   Please mark one 1.__approve of plan  2. ___approve of plan with the following conditions.   ______________________________                                                          _____________________ Physician Signature                                                                                                             Date

## 2011-06-07 NOTE — Patient Instructions (Addendum)
HEP

## 2011-06-08 ENCOUNTER — Ambulatory Visit (HOSPITAL_COMMUNITY)
Admission: RE | Admit: 2011-06-08 | Discharge: 2011-06-08 | Disposition: A | Discharge status: Home / Self Care | Payer: Worker's Compensation | Source: Ambulatory Visit | Attending: Preventative Medicine | Admitting: Preventative Medicine

## 2011-06-08 NOTE — Progress Notes (Signed)
Physical Therapy Treatment Patient Details  Name: Vanessa Richmond MRN: [id number] Date of Birth: Feb 18, 1971  Today's Date: 06/08/2011 Time: 1450-1550 Time Calculation (min): 60 min Visit#: 2  of 3   Charges: Therex x 34' MHP w/IFES x 15'  Subjective: Symptoms/Limitations Symptoms: Pt reports HEP compliance. Pain Assessment Currently in Pain?: Yes Pain Score:   7 Pain Location: Back Pain Orientation: Mid;Lower   Exercise/Treatments  Stretches Active Hamstring Stretch: 3 reps;30 seconds Single Knee to Chest Stretch: 3 reps;30 seconds Lower Trunk Rotation: 5 reps Stability Functional Squats: 10 reps Lifting: Limitations Lifting Limitations: Seated pelivc tilts A/P x 10  Modalities Modalities: Moist Heat;Electrical Stimulation Moist Heat Therapy Number Minutes Moist Heat: 15 Minutes Moist Heat Location: Other (comment) (Thoracic/lumbar area B w/IFES) Electrical Stimulation Electrical Stimulation Location: B lower thoracic/lumbar area Electrical Stimulation Parameters: IFES L 15 Electrical Stimulation Goals: Pain  Physical Therapy Assessment and Plan PT Assessment and Plan Clinical Impression Statement: Pt presents with very slow guarded movements. Pt with facial grimace with any movements of the trunk. Attempted quadruped pelvic tilts but pt unable to tolerate quadruped position. Pelvic tilts completed in sitting position. Pt requires multimodal cueing to facilitate mroe anterior pelvic til with functional squats. Pt reports pain decrease to 6/10 at end of session. PT Plan: Continue to progress per PT POC. Incorporate side bending exercises next session.     Problem List Patient Active Problem List  Diagnoses  . ANEMIA-IRON DEFICIENCY  . ANXIETY  . GASTRITIS  . WEIGHT LOSS  . NAUSEA ALONE  . ABDOMINAL PAIN-RUQ  . ABDOMINAL PAIN-RLQ  . ABDOMINAL PAIN, EPIGASTRIC  . Stiffness of joints, not elsewhere classified, multiple sites    PT - End of Session Activity  Tolerance: Patient tolerated treatment well General Behavior During Session: [facility] for tasks performed Cognition: [facility] for tasks performed  Antonieta Iba 06/08/2011, 4:01 PM

## 2011-06-09 ENCOUNTER — Ambulatory Visit (HOSPITAL_COMMUNITY)
Admission: RE | Admit: 2011-06-09 | Discharge: 2011-06-09 | Disposition: A | Discharge status: Home / Self Care | Payer: Worker's Compensation | Source: Ambulatory Visit | Attending: Preventative Medicine | Admitting: Preventative Medicine

## 2011-06-09 NOTE — Progress Notes (Signed)
Physical Therapy Treatment Patient Details  Name: Vanessa Richmond MRN: [id number] Date of Birth: 23-Oct-1970  Today's Date: 06/09/2011 Time: 4098-1191 Time Calculation (min): 28 min Visit#: 3  of 3   Re-eval:  N/A Charges:  therex 25'    Subjective: Symptoms/Limitations Symptoms: Pt. was 1 hour late for her appt.  Pt. states she has not been doing her exercises.  Pt states the machine/heat makes her pain higher (explained she voiced her pain was lower after receiving last visit). Pain Assessment Currently in Pain?: Yes Pain Score:   8 Pain Location: Back Pain Radiating Towards: Points from upper thoracic all way to belt line   Exercise/Treatments Stretches Active Hamstring Stretch: 3 reps;30 seconds Single Knee to Chest Stretch: 3 reps;30 seconds Lower Trunk Rotation: 5 reps Stability Bridge: 10 reps Straight Leg Raise: 10 reps   Physical Therapy Assessment and Plan PT Assessment and Plan Clinical Impression Statement: Pt. with constant facial grimmacing througout treatment.  Pt. took extended time to complete therex due to resting/pain.  Held modalities as pt. stated the IFES and heat did not help.  Pt. returns to MD tomorrow. PT Plan: Await further orders from MD.     Problem List Patient Active Problem List  Diagnoses  . ANEMIA-IRON DEFICIENCY  . ANXIETY  . GASTRITIS  . WEIGHT LOSS  . NAUSEA ALONE  . ABDOMINAL PAIN-RUQ  . ABDOMINAL PAIN-RLQ  . ABDOMINAL PAIN, EPIGASTRIC  . Stiffness of joints, not elsewhere classified, multiple sites    PT - End of Session Activity Tolerance: Patient limited by pain General Behavior During Session: [facility] for tasks performed Cognition: [facility] for tasks performed  Amy B. Bascom Levels, PTA 06/09/2011, 5:00 PM

## 2012-10-23 ENCOUNTER — Encounter (HOSPITAL_COMMUNITY): Payer: Self-pay | Admitting: Adult Health

## 2012-10-23 DIAGNOSIS — Z3202 Encounter for pregnancy test, result negative: Secondary | ICD-10-CM | POA: Insufficient documentation

## 2012-10-23 DIAGNOSIS — R110 Nausea: Secondary | ICD-10-CM | POA: Insufficient documentation

## 2012-10-23 DIAGNOSIS — M549 Dorsalgia, unspecified: Secondary | ICD-10-CM | POA: Insufficient documentation

## 2012-10-23 LAB — CBC WITH DIFFERENTIAL/PLATELET
Basophils Absolute: 0 10*3/uL (ref 0.0–0.1)
Basophils Relative: 0 % (ref 0–1)
Hemoglobin: 10.4 g/dL — ABNORMAL LOW (ref 12.0–15.0)
MCHC: 32 g/dL (ref 30.0–36.0)
Monocytes Relative: 7 % (ref 3–12)
Neutro Abs: 4.3 10*3/uL (ref 1.7–7.7)
Neutrophils Relative %: 58 % (ref 43–77)
Platelets: 396 10*3/uL (ref 150–400)
RDW: 19 % — ABNORMAL HIGH (ref 11.5–15.5)

## 2012-10-23 LAB — COMPREHENSIVE METABOLIC PANEL
ALT: 20 U/L (ref 0–35)
CO2: 25 mEq/L (ref 19–32)
Calcium: 9 mg/dL (ref 8.4–10.5)
Creatinine, Ser: 0.86 mg/dL (ref 0.50–1.10)
GFR calc Af Amer: >90 mL/min (ref >90)
GFR calc non Af Amer: 81 mL/min — ABNORMAL LOW (ref >90)
Glucose, Bld: 130 mg/dL — ABNORMAL HIGH (ref 70–99)
Sodium: 138 mEq/L (ref 135–145)

## 2012-10-23 LAB — URINALYSIS, ROUTINE W REFLEX MICROSCOPIC
Leukocytes, UA: NEGATIVE
Nitrite: NEGATIVE
Specific Gravity, Urine: 1.016 (ref 1.005–1.030)
Urobilinogen, UA: 0.2 mg/dL (ref 0.0–1.0)

## 2012-10-23 LAB — POCT PREGNANCY, URINE: Preg Test, Ur: NEGATIVE

## 2012-10-23 MED ORDER — FENTANYL CITRATE 0.05 MG/ML IJ SOLN
50.0000 ug | Freq: Once | INTRAMUSCULAR | Status: AC
  Administered 2012-10-23: 50 ug via INTRAVENOUS

## 2012-10-23 MED ORDER — FENTANYL CITRATE 0.05 MG/ML IJ SOLN
INTRAMUSCULAR | Status: AC
  Administered 2012-10-23: 50 ug via INTRAVENOUS
  Filled 2012-10-23: qty 2

## 2012-10-23 NOTE — ED Notes (Signed)
Presents with right sided flank pain that began 2 days ago and radiates to upper right quadrant associated with headache and painful urination. Pain is described as squeezing

## 2012-10-23 NOTE — ED Notes (Signed)
NURSE FIRST ROUNDS : RECEIVED FENTANYL 50 MCG IV AT TRIAGE FOR RIGHT FLANK PAIN WITH RELIEF , RESPIRATIONS UNLABORED , NURSE EXPLAINED DELAY , WAIT TIME AND PROCESS.

## 2012-10-24 ENCOUNTER — Encounter (HOSPITAL_COMMUNITY): Payer: Self-pay | Admitting: Radiology

## 2012-10-24 ENCOUNTER — Emergency Department (HOSPITAL_COMMUNITY): Payer: Medicaid Other

## 2012-10-24 ENCOUNTER — Emergency Department (HOSPITAL_COMMUNITY)
Admission: EM | Admit: 2012-10-24 | Discharge: 2012-10-24 | Disposition: A | Discharge status: Home / Self Care | Payer: 59 | Attending: Emergency Medicine | Admitting: Emergency Medicine

## 2012-10-24 DIAGNOSIS — M549 Dorsalgia, unspecified: Secondary | ICD-10-CM

## 2012-10-24 MED ORDER — HYDROMORPHONE HCL PF 1 MG/ML IJ SOLN
1.0000 mg | Freq: Once | INTRAMUSCULAR | Status: AC
  Administered 2012-10-24: 1 mg via INTRAVENOUS
  Filled 2012-10-24: qty 1

## 2012-10-24 MED ORDER — ONDANSETRON HCL 4 MG/2ML IJ SOLN
4.0000 mg | Freq: Once | INTRAMUSCULAR | Status: AC
  Administered 2012-10-24: 4 mg via INTRAVENOUS
  Filled 2012-10-24: qty 2

## 2012-10-24 MED ORDER — HYDROCODONE-ACETAMINOPHEN 5-325 MG PO TABS: 2.0000 | ORAL_TABLET | ORAL | Status: DC | PRN

## 2012-10-24 MED ORDER — IBUPROFEN 800 MG PO TABS: 800.0000 mg | ORAL_TABLET | Freq: Three times a day (TID) | ORAL | Status: DC

## 2012-10-24 MED ORDER — CYCLOBENZAPRINE HCL 10 MG PO TABS: 10.0000 mg | ORAL_TABLET | Freq: Two times a day (BID) | ORAL | Status: DC | PRN

## 2012-10-24 NOTE — ED Notes (Addendum)
Pt reports onset of rt flank pain that has gotten progressively worse x2 days - pt denies any fever or urinary symptoms at present.

## 2012-10-24 NOTE — ED Provider Notes (Signed)
History     CSN: [id number]  Arrival date & time 10/23/12  2054   First MD Initiated Contact with Patient 10/24/12 0054      Chief Complaint  Patient presents with  . Flank Pain    (Consider location/radiation/quality/duration/timing/severity/associated sxs/prior treatment) HPI HX per PT - R flank pain x 2 weeks, worse last night, slight nausea, no vomiting, no diarrhea, not affected by eating, she does not work, denies any heavy lifting, no F/C, no h/o same, not taking any medication for this at home. Pain severe tonight.   History reviewed. No pertinent past medical history.  No past surgical history on file.  No family history on file.  History  Substance Use Topics  . Smoking status: Not on file  . Smokeless tobacco: Not on file  . Alcohol Use: Not on file    OB History   Grav Para Term Preterm Abortions TAB SAB Ect Mult Living                  Review of Systems  Constitutional: Negative for fever and chills.  HENT: Negative for neck pain and neck stiffness.   Eyes: Negative for pain.  Respiratory: Negative for shortness of breath.   Cardiovascular: Negative for chest pain.  Gastrointestinal: Negative for blood in stool.  Genitourinary: Positive for flank pain. Negative for dysuria.  Musculoskeletal: Negative for back pain.  Skin: Negative for rash.  Neurological: Negative for headaches.  All other systems reviewed and are negative.    Allergies  Review of patient's allergies indicates no known allergies.  Home Medications  No current outpatient prescriptions on file.  BP 124/76  Pulse 76  Temp(Src) 98.8 F (37.1 C) (Oral)  Resp 18  SpO2 98%  Physical Exam  Constitutional: She is oriented to person, place, and time. She appears well-developed and well-nourished.  HENT:  Head: Normocephalic and atraumatic.  Eyes: EOM are normal. Pupils are equal, round, and reactive to light. No scleral icterus.  Neck: Neck supple.  Cardiovascular: Normal  rate, regular rhythm and intact distal pulses.   Pulmonary/Chest: Effort normal and breath sounds normal. No respiratory distress. She exhibits no tenderness.  Abdominal: Soft. Bowel sounds are normal. She exhibits no distension. There is no tenderness. There is no rebound and no guarding.  No CVAT, neg Murphys sign  Musculoskeletal: Normal range of motion. She exhibits no edema and no tenderness.  No midline lumbar or thoracic tenderness  Neurological: She is alert and oriented to person, place, and time. No cranial nerve deficit.  Skin: Skin is warm and dry.    ED Course  Procedures (including critical care time)  Results for orders placed during the hospital encounter of 10/24/12  LIPASE, BLOOD      Result Value Range   Lipase 68 (*) 11 - 59 U/L  COMPREHENSIVE METABOLIC PANEL      Result Value Range   Sodium 138  135 - 145 mEq/L   Potassium 3.9  3.5 - 5.1 mEq/L   Chloride 103  96 - 112 mEq/L   CO2 25  19 - 32 mEq/L   Glucose, Bld 130 (*) 70 - 99 mg/dL   BUN 17  6 - 23 mg/dL   Creatinine, Ser 7.82  0.50 - 1.10 mg/dL   Calcium 9.0  8.4 - 95.6 mg/dL   Total Protein 7.0  6.0 - 8.3 g/dL   Albumin 3.7  3.5 - 5.2 g/dL   AST 20  0 - 37 U/L  ALT 20  0 - 35 U/L   Alkaline Phosphatase 86  39 - 117 U/L   Total Bilirubin 0.1 (*) 0.3 - 1.2 mg/dL   GFR calc non Af Amer 81 (*) >90 mL/min   GFR calc Af Amer >90  >90 mL/min  CBC WITH DIFFERENTIAL      Result Value Range   WBC 7.5  4.0 - 10.5 K/uL   RBC 4.18  3.87 - 5.11 MIL/uL   Hemoglobin 10.4 (*) 12.0 - 15.0 g/dL   HCT 16.1 (*) 09.6 - 04.5 %   MCV 77.8 (*) 78.0 - 100.0 fL   MCH 24.9 (*) 26.0 - 34.0 pg   MCHC 32.0  30.0 - 36.0 g/dL   RDW 40.9 (*) 81.1 - 91.4 %   Platelets 396  150 - 400 K/uL   Neutrophils Relative % 58  43 - 77 %   Neutro Abs 4.3  1.7 - 7.7 K/uL   Lymphocytes Relative 30  12 - 46 %   Lymphs Abs 2.2  0.7 - 4.0 K/uL   Monocytes Relative 7  3 - 12 %   Monocytes Absolute 0.6  0.1 - 1.0 K/uL   Eosinophils Relative 5   0 - 5 %   Eosinophils Absolute 0.4  0.0 - 0.7 K/uL   Basophils Relative 0  0 - 1 %   Basophils Absolute 0.0  0.0 - 0.1 K/uL  URINALYSIS, ROUTINE W REFLEX MICROSCOPIC      Result Value Range   Color, Urine YELLOW  YELLOW   APPearance CLEAR  CLEAR   Specific Gravity, Urine 1.016  1.005 - 1.030   pH 6.0  5.0 - 8.0   Glucose, UA NEGATIVE  NEGATIVE mg/dL   Hgb urine dipstick NEGATIVE  NEGATIVE   Bilirubin Urine NEGATIVE  NEGATIVE   Ketones, ur NEGATIVE  NEGATIVE mg/dL   Protein, ur NEGATIVE  NEGATIVE mg/dL   Urobilinogen, UA 0.2  0.0 - 1.0 mg/dL   Nitrite NEGATIVE  NEGATIVE   Leukocytes, UA NEGATIVE  NEGATIVE  POCT PREGNANCY, URINE      Result Value Range   Preg Test, Ur NEGATIVE  NEGATIVE   Ct Abdomen Pelvis Wo Contrast  10/24/2012   *RADIOLOGY REPORT*  Clinical Data: Right-sided flank pain for 2 days, radiating to the right upper quadrant.  Dysuria.  CT ABDOMEN AND PELVIS WITHOUT CONTRAST  Technique:  Multidetector CT imaging of the abdomen and pelvis was performed following the standard protocol without intravenous contrast.  Comparison: CT of the abdomen and pelvis performed 08/20/2010  Findings: Minimal bibasilar atelectasis is noted.  A tiny hiatal hernia is suggested.  The liver and spleen are unremarkable in appearance.  The gallbladder is within normal limits.  The pancreas and adrenal glands are unremarkable.  The kidneys are unremarkable in appearance.  There is no evidence of hydronephrosis.  No renal or ureteral stones are seen.  No perinephric stranding is appreciated.  No free fluid is identified.  The small bowel is unremarkable in appearance.  The stomach is within normal limits.  No acute vascular abnormalities are seen.  The appendix is not well characterized but appears grossly unremarkable, without evidence for appendicitis.  The colon is unremarkable in appearance.  The bladder is mildly distended and grossly unremarkable in appearance.  The uterus is within normal limits.   The ovaries are relatively symmetric; no suspicious adnexal masses are seen.  No inguinal lymphadenopathy is seen.  No acute osseous abnormalities are identified.  There is  mild grade 1 anterolisthesis of L5 on S1, reflecting mild underlying facet disease.  This is grossly stable from 2012.  IMPRESSION:  1.  No acute abnormality seen within the abdomen or pelvis. 2.  Tiny hiatal hernia suspected.   Original Report Authenticated By: Tonia Ghent, M.D.   US Abdomen Complete  10/24/2012   *RADIOLOGY REPORT*  Clinical Data:  Right flank pain.  ABDOMINAL ULTRASOUND COMPLETE  Comparison:  CT of the abdomen and pelvis performed earlier today at 02:09 a.m., and abdominal ultrasound performed 04/05/2010  Findings:  Gallbladder:  The gallbladder is normal in appearance, without evidence for gallstones, gallbladder wall thickening or pericholecystic fluid.  No ultrasonographic Murphy's sign is elicited.  Common Bile Duct:  0.4 cm in diameter; within normal limits in caliber.  Liver:  Normal parenchymal echogenicity and echotexture; no focal lesions identified.  Limited Doppler evaluation demonstrates normal blood flow within the liver.  IVC:  Unremarkable in appearance.  Pancreas:  Not visualized due to overlying bowel gas.  Spleen:  9.2 cm in length; within normal limits in size and echotexture.  Right kidney:  9.5 cm in length; normal in size, configuration and parenchymal echogenicity.  No evidence of mass or hydronephrosis.  Left kidney:  10.4 cm in length; normal in size, configuration and parenchymal echogenicity.  No evidence of mass or hydronephrosis.  Abdominal Aorta:  Normal in caliber; no aneurysm identified.  Not well characterized distally due to overlying bowel gas.  IMPRESSION: Unremarkable abdominal ultrasound.   Original Report Authenticated By: Tonia Ghent, M.D.    IVFs, IV fentanyl, dilaudid  Recheck after medications much improved. Work up unrevealing for ABD etiology - plan f/u outpatient with  return precautiosn verbalized as understood  MDM  RUQ and back pain  Evaluated with labs and imaging reviewed as above IVfs and IV narcotics - condition improved VS and nursing notes reviewed        Sunnie Nielsen, MD 10/25/12 0502

## 2015-10-09 ENCOUNTER — Encounter: Payer: Self-pay | Admitting: Gastroenterology

## 2015-12-07 ENCOUNTER — Emergency Department (HOSPITAL_COMMUNITY)
Admission: EM | Admit: 2015-12-07 | Discharge: 2015-12-07 | Disposition: A | Discharge status: Home / Self Care | Payer: Self-pay | Attending: Emergency Medicine | Admitting: Emergency Medicine

## 2015-12-07 ENCOUNTER — Emergency Department (HOSPITAL_COMMUNITY): Payer: Self-pay

## 2015-12-07 ENCOUNTER — Encounter (HOSPITAL_COMMUNITY): Payer: Self-pay | Admitting: Emergency Medicine

## 2015-12-07 DIAGNOSIS — R079 Chest pain, unspecified: Secondary | ICD-10-CM | POA: Insufficient documentation

## 2015-12-07 DIAGNOSIS — Z79899 Other long term (current) drug therapy: Secondary | ICD-10-CM | POA: Insufficient documentation

## 2015-12-07 LAB — CBC
HEMATOCRIT: 35.2 % — AB (ref 36.0–46.0)
HEMOGLOBIN: 11.3 g/dL — AB (ref 12.0–15.0)
MCH: 27 pg (ref 26.0–34.0)
MCHC: 32.1 g/dL (ref 30.0–36.0)
MCV: 84.2 fL (ref 78.0–100.0)
Platelets: 405 10*3/uL — ABNORMAL HIGH (ref 150–400)
RBC: 4.18 MIL/uL (ref 3.87–5.11)
RDW: 15.3 % (ref 11.5–15.5)
WBC: 9.7 10*3/uL (ref 4.0–10.5)

## 2015-12-07 LAB — BASIC METABOLIC PANEL
ANION GAP: 6 (ref 5–15)
BUN: 15 mg/dL (ref 6–20)
CO2: 24 mmol/L (ref 22–32)
Calcium: 8.8 mg/dL — ABNORMAL LOW (ref 8.9–10.3)
Chloride: 107 mmol/L (ref 101–111)
Creatinine, Ser: 0.72 mg/dL (ref 0.44–1.00)
Glucose, Bld: 91 mg/dL (ref 65–99)
POTASSIUM: 3.8 mmol/L (ref 3.5–5.1)
SODIUM: 137 mmol/L (ref 135–145)

## 2015-12-07 LAB — I-STAT TROPONIN, ED: TROPONIN I, POC: 0.01 ng/mL (ref 0.00–0.08)

## 2015-12-07 MED ORDER — OMEPRAZOLE 20 MG PO CPDR: 20.0000 mg | DELAYED_RELEASE_CAPSULE | Freq: Every day | ORAL | Status: DC

## 2015-12-07 NOTE — ED Notes (Signed)
Pt. Stated, I have chest pain since March.

## 2015-12-07 NOTE — ED Notes (Signed)
Pt stable, ambulatory, states understanding of discharge instructions 

## 2015-12-07 NOTE — Discharge Instructions (Signed)
Dolor de pecho inespecfico  (Nonspecific Chest Pain) El dolor de pecho puede deberse a muchas enfermedades diferentes. Siempre existe una posibilidad de que el dolor est relacionado con algo grave, como un infarto de miocardio o un cogulo sanguneo en los pulmones. Hay muchas enfermedades que no son potencialmente mortales que pueden causar dolor de pecho. Si tiene dolor de pecho, es muy importante que se controle con el mdico. CAUSAS  Las causas del dolor de pecho pueden ser las siguientes:  Acidez estomacal.  Neumona o bronquitis.  Ansiedad o estrs.  Inflamacin de la zona que rodea al corazn (pericarditis) o a los pulmones (pleuritis o pleuresa).  Un cogulo sanguneo en el pulmn.  Colapso de un pulmn (neumotrax), que puede aparecer de manera repentina por s solo (neumotrax espontneo) o debido a un traumatismo en el trax.  Culebrilla (virus de la varicela zster).  Infarto de miocardio.  Dao de los huesos, los msculos y los cartlagos que conforman la pared torcica. Esto puede incluir lo siguiente:  Hematomas seos debido a lesiones.  Distensiones musculares o de los cartlagos por tos frecuente o repetida, o por exceso de trabajo.  Fractura de una o ms costillas.  Dolor de cartlago debido a inflamacin (costocondritis). FACTORES DE RIESGO  Los factores de riesgo de tener dolor de pecho pueden incluir lo siguiente:  Actividades que incrementan el riesgo de sufrir traumatismos o lesiones en el trax.  Infecciones o enfermedades respiratorias que causan tos frecuente.  Enfermedades o excesos en las comidas que pueden causar acidez.  Enfermedades cardacas o antecedentes familiares de enfermedades cardacas.  Enfermedades o comportamientos de salud que aumentan el riesgo de tener un cogulo sanguneo.  Haber tenido varicela (varicela zster). SIGNOS Y SNTOMAS El dolor de pecho puede provocar las siguientes sensaciones:  Ardor u hormigueo en la  superficie o en lo profundo del pecho.  Dolor opresivo, continuo o constrictivo.  Dolor vago o intenso que empeora al moverse, toser o inhalar profundamente.  Dolor que tambin se siente en la espalda, el cuello, el hombro o el brazo, o dolor que se irradia a cualquiera de estas zonas. El dolor de pecho puede aparecer y desaparecer, o bien puede ser constante. DIAGNSTICO Quizs se necesiten anlisis de laboratorio u otros estudios para encontrar la causa del dolor. El mdico puede indicarle que se haga una prueba llamada EGC (electrocadiograma) ambulatorio. El electrocardiograma registra los patrones de los latidos cardacos en el momento en que se realiza el estudio. Tambin pueden hacerle otros estudios, por ejemplo:  Ecocardiograma transtorcico (ETT). Durante el ecocardiograma, se usan ondas sonoras para crear una imagen de todas las estructuras cardacas y evaluar cmo circula la sangre por el corazn.  Ecocardiograma transesofgico (ETE).Este es un estudio de diagnstico por imgenes ms avanzado que el obtiene imgenes del interior del cuerpo. Le permite al mdico ver el corazn con mayor detalle.  Monitoreo cardaco. Permite que el mdico controle la frecuencia y el ritmo cardaco en tiempo real.  Monitor Holter. Es un dispositivo porttil que registra los latidos del corazn y puede ayudar a diagnosticar las arritmias cardacas. Le permite al mdico registrar la actividad cardaca durante varios das, si es necesario.  Pruebas de esfuerzo. Estas pueden realizarse durante el ejercicio o mediante la administracin de un medicamento que acelera los latidos del corazn.  Anlisis de sangre.  Diagnstico por imgenes. TRATAMIENTO  El tratamiento depende de la causa del dolor de pecho. El tratamiento puede incluir lo siguiente:  Medicamentos. Estos pueden incluir lo siguiente:    Inhibidores de la acidez estomacal.  Antiinflamatorios.  Analgsicos para las enfermedades  inflamatorias.  Antibiticos, si hay una infeccin.  Medicamentos para disolver los cogulos sanguneos.  Medicamentos para tratar la enfermedad arterial coronaria.  Tratamiento complementario para las enfermedades que no requieren la toma de medicamentos. Esto puede incluir lo siguiente:  Descansar.  Aplicar compresas fras o calientes en las zonas lesionadas.  Limitar las actividades hasta que disminuya el dolor. INSTRUCCIONES PARA EL CUIDADO EN EL HOGAR  Si le recetaron antibiticos, asegrese de terminarlos, incluso si comienza a sentirse mejor.  Evite las actividades que le causen dolor de pecho.  No consuma ningn producto que contenga tabaco, lo que incluye cigarrillos, tabaco de mascar o cigarrillos electrnicos. Si necesita ayuda para dejar de fumar, consulte al mdico.  No beba alcohol.  Tome los medicamentos solamente como se lo haya indicado el mdico.  Concurra a todas las visitas de control como se lo haya indicado el mdico. Esto es importante. Esto incluye otros estudios si el dolor de pecho no desaparece.  Si la acidez es la causa del dolor de pecho, tal vez le aconsejen que mantenga la cabeza levantada (elevada) mientras duerme. Esto reduce la probabilidad de que el cido retroceda del estmago al esfago.  Haga cambios en su estilo de vida como se lo haya indicado el mdico. Estos pueden incluir lo siguiente:  Practicar actividad fsica con regularidad. Pida al mdico que le sugiera algunas actividades que sean seguras para usted.  Consumir una dieta cardiosaludable. Un nutricionista matriculado puede ayudarlo a hacer elecciones saludables.  Mantener un peso saludable.  Controlar la diabetes, si es necesario.  Reducir las situaciones de estrs. SOLICITE ATENCIN MDICA SI:  El dolor de pecho no desaparece despus del tratamiento.  Tiene una erupcin cutnea con ampollas en el pecho.  Tiene fiebre. SOLICITE ATENCIN MDICA DE INMEDIATO SI:   El  dolor en el pecho es ms intenso.  La tos empeora, o expectora sangre.  Siente un dolor abdominal intenso.  Siente debilidad intensa.  Se desmaya.  Tiene escalofros.  Tiene una molestia repentina e inexplicable en el pecho.  Tiene molestias repentinas e inexplicables en los brazos, la espalda, el cuello o la mandbula.  Le falta el aire en cualquier momento.  Comienza a sudar de manera repentina o la piel se le humedece.  Siente nuseas o vomita.  Se siente repentinamente mareado o se desmaya.  Siente que el corazn comienza a latir rpidamente o que se saltea latidos. Estos sntomas pueden representar un problema grave que constituye una emergencia. No espere hasta que los sntomas desaparezcan. Solicite atencin mdica de inmediato. Comunquese con el servicio de emergencias de su localidad (911 en los [place]). No conduzca por sus propios medios hasta el hospital.   Esta informacin no tiene como fin reemplazar el consejo del mdico. Asegrese de hacerle al mdico cualquier pregunta que tenga.   Document Released: 05/16/2005 Document Revised: 06/06/2014 Elsevier Interactive Patient Education 2016 [employer].  

## 2015-12-07 NOTE — ED Provider Notes (Signed)
CSN: [id number]     Arrival date & time 12/07/15  1200 History   First MD Initiated Contact with Patient 12/07/15 1528     Chief Complaint  Patient presents with  . Chest Pain      Patient is a 45 y.o. female presenting with chest pain.  Chest Pain Pain location:  L chest Associated symptoms: cough   Associated symptoms: no abdominal pain, no back pain, no headache, no nausea, no numbness, no shortness of breath, not vomiting and no weakness   patient presents with months of chest pain.It is dull. Has been there every day. Somewhat worse after eating. Occasional cough. No swelling or legs. No recent travel. She does not smoke. No fevers or chills. No nausea vomiting or diarrhea. No weight loss. She will occasionally have epigastric pain also. States she takes naproxen and it will help for a little while.  History reviewed. No pertinent past medical history. History reviewed. No pertinent past surgical history. No family history on file. Social History  Substance Use Topics  . Smoking status: Never Smoker   . Smokeless tobacco: None  . Alcohol Use: No   OB History    No data available     Review of Systems  Constitutional: Negative for activity change and appetite change.  Eyes: Negative for pain.  Respiratory: Positive for cough. Negative for chest tightness and shortness of breath.   Cardiovascular: Positive for chest pain. Negative for leg swelling.  Gastrointestinal: Negative for nausea, vomiting, abdominal pain and diarrhea.  Genitourinary: Negative for flank pain.  Musculoskeletal: Positive for neck pain. Negative for back pain and neck stiffness.  Skin: Negative for rash.  Neurological: Negative for weakness, numbness and headaches.  Psychiatric/Behavioral: Negative for behavioral problems.       Allergies  Review of patient's allergies indicates no known allergies.  Home Medications   Prior to Admission medications   Medication Sig Start Date End Date Taking?  Authorizing Provider  cyclobenzaprine (FLEXERIL) 10 MG tablet Take 1 tablet (10 mg total) by mouth 2 (two) times daily as needed for muscle spasms. 10/24/12   Sunnie Nielsen, MD  HYDROcodone-acetaminophen (NORCO/VICODIN) 5-325 MG per tablet Take 2 tablets by mouth every 4 (four) hours as needed for pain. 10/24/12   Sunnie Nielsen, MD  ibuprofen (ADVIL,MOTRIN) 800 MG tablet Take 1 tablet (800 mg total) by mouth 3 (three) times daily. 10/24/12   Sunnie Nielsen, MD  omeprazole (PRILOSEC) 20 MG capsule Take 1 capsule (20 mg total) by mouth daily. 12/07/15   Benjiman Core, MD   BP 141/80 mmHg  Pulse 66  Temp(Src) 98.1 F (36.7 C) (Oral)  Resp 14  Ht  (1.626 m)  Wt 170 lb (77.111 kg)  BMI 29.17 kg/m2  SpO2 100%  LMP 12/03/2015 Physical Exam  Constitutional: She is oriented to person, place, and time. She appears well-developed.  HENT:  Head: Atraumatic.  Cardiovascular: Normal rate.   Pulmonary/Chest: Effort normal.  Abdominal: Soft.  Neurological: She is alert and oriented to person, place, and time.  Skin: Skin is warm.    ED Course  Procedures (including critical care time) Labs Review Labs Reviewed  BASIC METABOLIC PANEL - Abnormal; Notable for the following:    Calcium 8.8 (*)    All other components within normal limits  CBC - Abnormal; Notable for the following:    Hemoglobin 11.3 (*)    HCT 35.2 (*)    Platelets 405 (*)    All other components within normal limits  Rosezena SensorI-STAT TROPOININ, ED    Imaging Review Dg Chest 2 View  12/07/2015  CLINICAL DATA:  Chest pain and cough EXAM: CHEST  2 VIEW COMPARISON:  05/27/2011 FINDINGS: Cardiac shadow is within normal limits. The lungs are well aerated bilaterally. No acute bony abnormality is seen. IMPRESSION: No active cardiopulmonary disease. Electronically Signed   By: Alcide CleverMark  Lukens M.D.   On: 12/07/2015 13:23   I have personally reviewed and evaluated these images and lab results as part of my medical decision-making.   EKG  Interpretation   Date/Time:  Monday December 07 2015 12:07:13 EDT Ventricular Rate:  80 PR Interval:  172 QRS Duration: 80 QT Interval:  368 QTC Calculation: 424 R Axis:   23 Text Interpretation:  Normal sinus rhythm Normal ECG Confirmed by  Rubin PayorPICKERING  MD, Harrold DonathNATHAN [phone]) on 12/07/2015 3:29:23 PM      MDM   Final diagnoses:  Chest pain, unspecified chest pain type    Patient with chest pain. Epigastric area going to chest and occasionally neck. Has had for months. Not associated with exertion. EKG reassuring. Enzymes negative. Has had a history of gastritis. Has reportedly been doing as NSAIDs for this.    Benjiman CoreNathan Naythen Heikkila, MD 12/07/15 [phone]

## 2020-02-10 ENCOUNTER — Encounter (HOSPITAL_COMMUNITY): Payer: Self-pay

## 2020-02-10 ENCOUNTER — Emergency Department (HOSPITAL_COMMUNITY)
Admission: EM | Admit: 2020-02-10 | Discharge: 2020-02-11 | Disposition: A | Discharge status: Home / Self Care | Payer: 59 | Attending: Emergency Medicine | Admitting: Emergency Medicine

## 2020-02-10 ENCOUNTER — Other Ambulatory Visit: Payer: Self-pay

## 2020-02-10 DIAGNOSIS — R519 Headache, unspecified: Secondary | ICD-10-CM | POA: Insufficient documentation

## 2020-02-10 DIAGNOSIS — R109 Unspecified abdominal pain: Secondary | ICD-10-CM | POA: Insufficient documentation

## 2020-02-10 DIAGNOSIS — Z5321 Procedure and treatment not carried out due to patient leaving prior to being seen by health care provider: Secondary | ICD-10-CM | POA: Insufficient documentation

## 2020-02-10 DIAGNOSIS — R05 Cough: Secondary | ICD-10-CM | POA: Insufficient documentation

## 2020-02-10 DIAGNOSIS — R110 Nausea: Secondary | ICD-10-CM | POA: Insufficient documentation

## 2020-02-10 DIAGNOSIS — N939 Abnormal uterine and vaginal bleeding, unspecified: Secondary | ICD-10-CM | POA: Insufficient documentation

## 2020-02-10 DIAGNOSIS — R5382 Chronic fatigue, unspecified: Secondary | ICD-10-CM | POA: Insufficient documentation

## 2020-02-10 LAB — CBC
HCT: 28.8 % — ABNORMAL LOW (ref 36.0–46.0)
Hemoglobin: 8.8 g/dL — ABNORMAL LOW (ref 12.0–15.0)
MCH: 25.3 pg — ABNORMAL LOW (ref 26.0–34.0)
MCHC: 30.6 g/dL (ref 30.0–36.0)
MCV: 82.8 fL (ref 80.0–100.0)
Platelets: 453 10*3/uL — ABNORMAL HIGH (ref 150–400)
RBC: 3.48 MIL/uL — ABNORMAL LOW (ref 3.87–5.11)
RDW: 17.2 % — ABNORMAL HIGH (ref 11.5–15.5)
WBC: 8.4 10*3/uL (ref 4.0–10.5)
nRBC: 0 % (ref 0.0–0.2)

## 2020-02-10 LAB — I-STAT BETA HCG BLOOD, ED (MC, WL, AP ONLY): I-stat hCG, quantitative: <5 m[IU]/mL (ref <5)

## 2020-02-10 LAB — BASIC METABOLIC PANEL
Anion gap: 10 (ref 5–15)
BUN: 8 mg/dL (ref 6–20)
CO2: 23 mmol/L (ref 22–32)
Calcium: 8.6 mg/dL — ABNORMAL LOW (ref 8.9–10.3)
Chloride: 103 mmol/L (ref 98–111)
Creatinine, Ser: 0.7 mg/dL (ref 0.44–1.00)
GFR calc Af Amer: >60 mL/min (ref >60)
GFR calc non Af Amer: >60 mL/min (ref >60)
Glucose, Bld: 89 mg/dL (ref 70–99)
Potassium: 3.7 mmol/L (ref 3.5–5.1)
Sodium: 136 mmol/L (ref 135–145)

## 2020-02-10 NOTE — ED Triage Notes (Addendum)
Pt arrives to ED w/ c/o 8/10 abdominal pain and vaginal bleeding x 2 months. Pt also c/o cough, lethargy, and headache x 3 months. Pt also endorses intermittent nausea. Pt denies diarrhea, vomiting.

## 2020-02-11 ENCOUNTER — Inpatient Hospital Stay (HOSPITAL_COMMUNITY)
Admission: EM | Admit: 2020-02-11 | Discharge: 2020-02-14 | DRG: 760 | Disposition: A | Discharge status: Home / Self Care | Payer: Self-pay | Attending: Internal Medicine | Admitting: Internal Medicine

## 2020-02-11 ENCOUNTER — Encounter (HOSPITAL_COMMUNITY): Payer: Self-pay

## 2020-02-11 ENCOUNTER — Other Ambulatory Visit: Payer: Self-pay

## 2020-02-11 DIAGNOSIS — N939 Abnormal uterine and vaginal bleeding, unspecified: Secondary | ICD-10-CM

## 2020-02-11 DIAGNOSIS — K219 Gastro-esophageal reflux disease without esophagitis: Secondary | ICD-10-CM | POA: Diagnosis present

## 2020-02-11 DIAGNOSIS — R55 Syncope and collapse: Secondary | ICD-10-CM

## 2020-02-11 DIAGNOSIS — Z79899 Other long term (current) drug therapy: Secondary | ICD-10-CM

## 2020-02-11 DIAGNOSIS — N920 Excessive and frequent menstruation with regular cycle: Principal | ICD-10-CM | POA: Diagnosis present

## 2020-02-11 DIAGNOSIS — E669 Obesity, unspecified: Secondary | ICD-10-CM | POA: Diagnosis present

## 2020-02-11 DIAGNOSIS — Z20822 Contact with and (suspected) exposure to covid-19: Secondary | ICD-10-CM | POA: Diagnosis present

## 2020-02-11 DIAGNOSIS — D62 Acute posthemorrhagic anemia: Secondary | ICD-10-CM | POA: Diagnosis present

## 2020-02-11 DIAGNOSIS — D649 Anemia, unspecified: Secondary | ICD-10-CM | POA: Diagnosis present

## 2020-02-11 DIAGNOSIS — H538 Other visual disturbances: Secondary | ICD-10-CM | POA: Diagnosis present

## 2020-02-11 DIAGNOSIS — Z6833 Body mass index (BMI) 33.0-33.9, adult: Secondary | ICD-10-CM

## 2020-02-11 DIAGNOSIS — R102 Pelvic and perineal pain: Secondary | ICD-10-CM

## 2020-02-11 HISTORY — DX: Gastro-esophageal reflux disease without esophagitis: K21.9

## 2020-02-11 HISTORY — DX: Iron deficiency anemia, unspecified: D50.9

## 2020-02-11 LAB — COMPREHENSIVE METABOLIC PANEL
ALT: 17 U/L (ref 0–44)
AST: 19 U/L (ref 15–41)
Albumin: 3.4 g/dL — ABNORMAL LOW (ref 3.5–5.0)
Alkaline Phosphatase: 74 U/L (ref 38–126)
Anion gap: 9 (ref 5–15)
BUN: 11 mg/dL (ref 6–20)
CO2: 24 mmol/L (ref 22–32)
Calcium: 8.9 mg/dL (ref 8.9–10.3)
Chloride: 104 mmol/L (ref 98–111)
Creatinine, Ser: 0.77 mg/dL (ref 0.44–1.00)
GFR calc Af Amer: >60 mL/min (ref >60)
GFR calc non Af Amer: >60 mL/min (ref >60)
Glucose, Bld: 107 mg/dL — ABNORMAL HIGH (ref 70–99)
Potassium: 3.8 mmol/L (ref 3.5–5.1)
Sodium: 137 mmol/L (ref 135–145)
Total Bilirubin: 0.2 mg/dL — ABNORMAL LOW (ref 0.3–1.2)
Total Protein: 6.5 g/dL (ref 6.5–8.1)

## 2020-02-11 LAB — CBC
HCT: 28.9 % — ABNORMAL LOW (ref 36.0–46.0)
Hemoglobin: 8.5 g/dL — ABNORMAL LOW (ref 12.0–15.0)
MCH: 24.6 pg — ABNORMAL LOW (ref 26.0–34.0)
MCHC: 29.4 g/dL — ABNORMAL LOW (ref 30.0–36.0)
MCV: 83.8 fL (ref 80.0–100.0)
Platelets: 467 10*3/uL — ABNORMAL HIGH (ref 150–400)
RBC: 3.45 MIL/uL — ABNORMAL LOW (ref 3.87–5.11)
RDW: 17.3 % — ABNORMAL HIGH (ref 11.5–15.5)
WBC: 7.5 10*3/uL (ref 4.0–10.5)
nRBC: 0 % (ref 0.0–0.2)

## 2020-02-11 LAB — ABO/RH: ABO/RH(D): O NEG

## 2020-02-11 NOTE — ED Triage Notes (Signed)
Spanish interpreter used for triage:  Pt reports cough, headache, body aches and fatigue for the past 3 months as well as vaginal bleeding. Pt seen here yesterday but LWBS.

## 2020-02-11 NOTE — ED Notes (Signed)
No answer in WR

## 2020-02-12 ENCOUNTER — Emergency Department (HOSPITAL_COMMUNITY): Payer: Self-pay

## 2020-02-12 ENCOUNTER — Encounter (HOSPITAL_COMMUNITY): Payer: Self-pay | Admitting: Internal Medicine

## 2020-02-12 ENCOUNTER — Other Ambulatory Visit: Payer: Self-pay

## 2020-02-12 DIAGNOSIS — N939 Abnormal uterine and vaginal bleeding, unspecified: Secondary | ICD-10-CM | POA: Diagnosis present

## 2020-02-12 DIAGNOSIS — D649 Anemia, unspecified: Secondary | ICD-10-CM | POA: Diagnosis present

## 2020-02-12 LAB — CBC
HCT: 23.4 % — ABNORMAL LOW (ref 36.0–46.0)
HCT: 27.4 % — ABNORMAL LOW (ref 36.0–46.0)
Hemoglobin: 6.9 g/dL — CL (ref 12.0–15.0)
Hemoglobin: 8.6 g/dL — ABNORMAL LOW (ref 12.0–15.0)
MCH: 24.6 pg — ABNORMAL LOW (ref 26.0–34.0)
MCH: 25.7 pg — ABNORMAL LOW (ref 26.0–34.0)
MCHC: 29.5 g/dL — ABNORMAL LOW (ref 30.0–36.0)
MCHC: 31.4 g/dL (ref 30.0–36.0)
MCV: 83.6 fL (ref 80.0–100.0)
MCV: 82 fL (ref 80.0–100.0)
Platelets: 386 10*3/uL (ref 150–400)
Platelets: 395 10*3/uL (ref 150–400)
RBC: 2.8 MIL/uL — ABNORMAL LOW (ref 3.87–5.11)
RBC: 3.34 MIL/uL — ABNORMAL LOW (ref 3.87–5.11)
RDW: 17.4 % — ABNORMAL HIGH (ref 11.5–15.5)
RDW: 16.8 % — ABNORMAL HIGH (ref 11.5–15.5)
WBC: 6.5 10*3/uL (ref 4.0–10.5)
WBC: 8.7 10*3/uL (ref 4.0–10.5)
nRBC: 0 % (ref 0.0–0.2)
nRBC: 0 % (ref 0.0–0.2)

## 2020-02-12 LAB — HIV ANTIBODY (ROUTINE TESTING W REFLEX): HIV Screen 4th Generation wRfx: NONREACTIVE

## 2020-02-12 LAB — TSH: TSH: 2.194 u[IU]/mL (ref 0.350–4.500)

## 2020-02-12 LAB — FOLATE: Folate: 17.9 ng/mL (ref >5.9)

## 2020-02-12 LAB — TROPONIN I (HIGH SENSITIVITY)
Troponin I (High Sensitivity): 4 ng/L (ref <18)
Troponin I (High Sensitivity): 6 ng/L (ref <18)

## 2020-02-12 LAB — PHOSPHORUS: Phosphorus: 3.4 mg/dL (ref 2.5–4.6)

## 2020-02-12 LAB — MAGNESIUM: Magnesium: 1.7 mg/dL (ref 1.7–2.4)

## 2020-02-12 LAB — VITAMIN B12: Vitamin B-12: 197 pg/mL (ref 180–914)

## 2020-02-12 LAB — PROTIME-INR
INR: 1.1 (ref 0.8–1.2)
Prothrombin Time: 13.5 seconds (ref 11.4–15.2)

## 2020-02-12 LAB — SARS CORONAVIRUS 2 BY RT PCR (HOSPITAL ORDER, PERFORMED IN ~~LOC~~ HOSPITAL LAB): SARS Coronavirus 2: NEGATIVE

## 2020-02-12 LAB — FERRITIN: Ferritin: 6 ng/mL — ABNORMAL LOW (ref 11–307)

## 2020-02-12 LAB — IRON AND TIBC
Iron: 27 ug/dL — ABNORMAL LOW (ref 28–170)
Saturation Ratios: 6 % — ABNORMAL LOW (ref 10.4–31.8)
TIBC: 490 ug/dL — ABNORMAL HIGH (ref 250–450)
UIBC: 463 ug/dL

## 2020-02-12 LAB — I-STAT BETA HCG BLOOD, ED (MC, WL, AP ONLY): I-stat hCG, quantitative: <5 m[IU]/mL (ref <5)

## 2020-02-12 LAB — PREPARE RBC (CROSSMATCH)

## 2020-02-12 MED ORDER — ACETAMINOPHEN 650 MG RE SUPP: 650.0000 mg | Freq: Four times a day (QID) | RECTAL | Status: DC | PRN

## 2020-02-12 MED ORDER — ONDANSETRON HCL 4 MG PO TABS
4.0000 mg | ORAL_TABLET | Freq: Four times a day (QID) | ORAL | Status: DC | PRN
  Administered 2020-02-13 (×2): 4 mg via ORAL
  Filled 2020-02-12 (×2): qty 1

## 2020-02-12 MED ORDER — ESTROGENS CONJUGATED 25 MG IJ SOLR
25.0000 mg | Freq: Once | INTRAMUSCULAR | Status: AC
  Administered 2020-02-12: 25 mg via INTRAVENOUS
  Filled 2020-02-12: qty 25

## 2020-02-12 MED ORDER — IOHEXOL 300 MG/ML  SOLN
75.0000 mL | Freq: Once | INTRAMUSCULAR | Status: AC | PRN
  Administered 2020-02-12: 75 mL via INTRAVENOUS

## 2020-02-12 MED ORDER — DIPHENHYDRAMINE HCL 50 MG/ML IJ SOLN
25.0000 mg | Freq: Once | INTRAMUSCULAR | Status: AC
  Administered 2020-02-12: 25 mg via INTRAVENOUS
  Filled 2020-02-12: qty 1

## 2020-02-12 MED ORDER — SODIUM CHLORIDE 0.9 % IV SOLN
10.0000 mL/h | Freq: Once | INTRAVENOUS | Status: AC
  Administered 2020-02-12: 10 mL/h via INTRAVENOUS

## 2020-02-12 MED ORDER — PROCHLORPERAZINE EDISYLATE 10 MG/2ML IJ SOLN
10.0000 mg | Freq: Once | INTRAMUSCULAR | Status: AC
  Administered 2020-02-12: 10 mg via INTRAVENOUS
  Filled 2020-02-12: qty 2

## 2020-02-12 MED ORDER — ONDANSETRON HCL 4 MG/2ML IJ SOLN
4.0000 mg | Freq: Four times a day (QID) | INTRAMUSCULAR | Status: DC | PRN
  Administered 2020-02-14: 4 mg via INTRAVENOUS
  Filled 2020-02-12: qty 2

## 2020-02-12 MED ORDER — ACETAMINOPHEN 325 MG PO TABS
650.0000 mg | ORAL_TABLET | Freq: Four times a day (QID) | ORAL | Status: DC | PRN
  Administered 2020-02-12 – 2020-02-13 (×3): 650 mg via ORAL
  Filled 2020-02-12 (×3): qty 2

## 2020-02-12 MED ORDER — SODIUM CHLORIDE 0.9 % IV BOLUS
1000.0000 mL | Freq: Once | INTRAVENOUS | Status: AC
Start: 2020-02-12 — End: 2020-02-12
  Administered 2020-02-12: 1000 mL via INTRAVENOUS

## 2020-02-12 MED ORDER — MEGESTROL ACETATE 40 MG PO TABS
40.0000 mg | ORAL_TABLET | Freq: Two times a day (BID) | ORAL | Status: DC
  Administered 2020-02-12 – 2020-02-14 (×5): 40 mg via ORAL
  Filled 2020-02-12 (×7): qty 1

## 2020-02-12 MED ORDER — MORPHINE SULFATE (PF) 2 MG/ML IV SOLN
2.0000 mg | INTRAVENOUS | Status: DC | PRN
  Administered 2020-02-13: 2 mg via INTRAVENOUS
  Filled 2020-02-12 (×2): qty 1

## 2020-02-12 NOTE — ED Notes (Signed)
Pt transported to [facility]

## 2020-02-12 NOTE — H&P (Addendum)
History and Physical    AMAZING COWMAN RCV:[id number] DOB: Jan 11, 1971 DOA: 02/11/2020  PCP: Patient, No Pcp Per  Patient coming from: home  I have personally briefly reviewed patient's old medical records in [facility]  Chief Complaint: Heavy vaginal bleeding since 1-1/2 months  HPI: Vanessa Richmond is a 49 y.o. female with medical history significant of iron deficiency anemia, obesity presents to emergency department with heavy vaginal bleeding since 1-1/2 months.  Patient speaks Spanish.  Interpreter used for history.  Patient reports heavy vaginal bleeding since 3 months however it got worse since 1/27-month.  Reports that she has been passing clots, uses 8 pads per day, associated with crampy lower abdominal pain, generalized weakness, fatigue, mild substernal chest pain, lightheadedness, headache, blurry vision, no energy.  No history of bleeding disorder, not on blood thinner, had Pap smear 6 months ago was negative.  She is G3, P3, had normal vaginal deliveries.  Denies previous history of STDs.  No history of head trauma, loss of consciousness, leg swelling, orthopnea, PND, recent travel, vomiting, hematemesis, melena, urinary changes.  Was seen by OB/GYN in the past and was given OCPs.  She is fully vaccinated against COVID-19.  No history of smoking, alcohol, illicit drug use.  ED Course: Upon arrival to ED: Patient waited for more than 24 hours.  She came to ED yesterday however she left without being seen.   vital signs stable, H&H 6.9/23.4-was 8.8/28.8 yesterday.  TSH, PT/INR: WNL, troponin x2 - COVID-19, UA: Negative, chest x-ray was obtained which came back negative for acute findings.  CT venography was obtained to rule out venous thrombosis: Came back negative.  Pelvic ultrasound was obtained which showed thickened heterogeneous endometrium with increased vascularity consistent with the patient's given clinical history of vaginal bleeding.  She was given headache cocktail in ED  and her headache improved.  1 unit PRBC ordered.  EDP talked to OB/GYN who recommended Premarin 25 mg IV once and Megace 40 mg twice daily.  [facility] hospital[facility] consulted for admission for symptomatic anemia.  Review of Systems: As per HPI otherwise negative.    Past Medical History:  Diagnosis Date  . GERD (gastroesophageal reflux disease)   . Iron deficiency anemia     Past Surgical History:  Procedure Laterality Date  . VAGINAL DELIVERY       reports that she has never smoked. She has never used smokeless tobacco. She reports that she does not drink alcohol and does not use drugs.  No Known Allergies  History reviewed. No pertinent family history.  Prior to Admission medications   Medication Sig Start Date End Date Taking? Authorizing Provider  cyclobenzaprine (FLEXERIL) 10 MG tablet Take 1 tablet (10 mg total) by mouth 2 (two) times daily as needed for muscle spasms. 10/24/12   Sunnie Nielsen, MD  HYDROcodone-acetaminophen (NORCO/VICODIN) 5-325 MG per tablet Take 2 tablets by mouth every 4 (four) hours as needed for pain. 10/24/12   Sunnie Nielsen, MD  ibuprofen (ADVIL,MOTRIN) 800 MG tablet Take 1 tablet (800 mg total) by mouth 3 (three) times daily. 10/24/12   Sunnie Nielsen, MD  omeprazole (PRILOSEC) 20 MG capsule Take 1 capsule (20 mg total) by mouth daily. 12/07/15   Benjiman Core, MD    Physical Exam: Vitals:   02/12/20 1207 02/12/20 1309 02/12/20 1315 02/12/20 1503  BP:  127/80 121/74 124/75  Pulse: 85 84 76 82  Resp:   16 17  Temp:    98.8 F (37.1 C)  TempSrc:  Oral  SpO2: 100% 100% 100% 100%    Constitutional: NAD, calm, comfortable, on room air, communicating well Eyes: PERRL, lids and conjunctivae: Pale ENMT: Mucous membranes are moist. Posterior pharynx clear of any exudate or lesions.Normal dentition.  Neck: normal, supple, no masses, no thyromegaly Respiratory: clear to auscultation bilaterally, no wheezing, no crackles. Normal respiratory effort. No  accessory muscle use.  Cardiovascular: Regular rate and rhythm, no murmurs / rubs / gallops. No extremity edema. 2+ pedal pulses. No carotid bruits.  Abdomen: Soft, lower abdominal tenderness positive, no guarding, no rigidity, no masses palpated. No hepatosplenomegaly. Bowel sounds positive.  Musculoskeletal: no clubbing / cyanosis. No joint deformity upper and lower extremities. Good ROM, no contractures. Normal muscle tone.  Skin: no rashes, lesions, ulcers. No induration Neurologic: CN 2-12 grossly intact. Sensation intact, DTR normal. Strength 5/5 in all 4.  Psychiatric: Normal judgment and insight. Alert and oriented x 3. Normal mood.    Labs on Admission: I have personally reviewed following labs and imaging studies  CBC: Recent Labs  Lab 02/10/20 1837 02/11/20 1601 02/12/20 0958  WBC 8.4 7.5 6.5  HGB 8.8* 8.5* 6.9*  HCT 28.8* 28.9* 23.4*  MCV 82.8 83.8 83.6  PLT 453* 467* 386   Basic Metabolic Panel: Recent Labs  Lab 02/10/20 1837 02/11/20 1601  NA 136 137  K 3.7 3.8  CL 103 104  CO2 23 24  GLUCOSE 89 107*  BUN 8 11  CREATININE 0.70 0.77  CALCIUM 8.6* 8.9   GFR: CrCl cannot be calculated (Unknown ideal weight.). Liver Function Tests: Recent Labs  Lab 02/11/20 1601  AST 19  ALT 17  ALKPHOS 74  BILITOT 0.2*  PROT 6.5  ALBUMIN 3.4*   No results for input(s): LIPASE, AMYLASE in the last 168 hours. No results for input(s): AMMONIA in the last 168 hours. Coagulation Profile: Recent Labs  Lab 02/12/20 0958  INR 1.1   Cardiac Enzymes: No results for input(s): CKTOTAL, CKMB, CKMBINDEX, TROPONINI in the last 168 hours. BNP (last 3 results) No results for input(s): PROBNP in the last 8760 hours. HbA1C: No results for input(s): HGBA1C in the last 72 hours. CBG: No results for input(s): GLUCAP in the last 168 hours. Lipid Profile: No results for input(s): CHOL, HDL, LDLCALC, TRIG, CHOLHDL, LDLDIRECT in the last 72 hours. Thyroid Function Tests: Recent  Labs    02/12/20 0958  TSH 2.194   Anemia Panel: No results for input(s): VITAMINB12, FOLATE, FERRITIN, TIBC, IRON, RETICCTPCT in the last 72 hours. Urine analysis:    Component Value Date/Time   COLORURINE YELLOW 10/23/2012 2314   APPEARANCEUR CLEAR 10/23/2012 2314   LABSPEC 1.016 10/23/2012 2314   PHURINE 6.0 10/23/2012 2314   GLUCOSEU NEGATIVE 10/23/2012 2314   HGBUR NEGATIVE 10/23/2012 2314   BILIRUBINUR NEGATIVE 10/23/2012 2314   KETONESUR NEGATIVE 10/23/2012 2314   PROTEINUR NEGATIVE 10/23/2012 2314   UROBILINOGEN 0.2 10/23/2012 2314   NITRITE NEGATIVE 10/23/2012 2314   LEUKOCYTESUR NEGATIVE 10/23/2012 2314    Radiological Exams on Admission: DG Chest 2 View  Result Date: 02/12/2020 CLINICAL DATA:  Shortness of breath and cough.  Chest pain. EXAM: CHEST - 2 VIEW COMPARISON:  December 07, 2015 FINDINGS: Lungs are clear. Heart size and pulmonary vascularity are normal. No adenopathy. No bone lesions. No pneumothorax. IMPRESSION: Lungs clear.  Cardiac silhouette normal. Electronically Signed   By: Bretta BangWilliam  Woodruff III M.D.   On: 02/12/2020 09:20   CT VENOGRAM HEAD  Result Date: 02/12/2020 CLINICAL DATA:  Dural venous  thrombosis suspected. Headache and blurred vision. Left-sided numbness. EXAM: CT VENOGRAM HEAD TECHNIQUE: Head CT was performed followed by CT venography. CONTRAST:  12mL OMNIPAQUE IOHEXOL 300 MG/ML  SOLN COMPARISON:  Head CT 08/20/2010 FINDINGS: Brain has normal appearance without evidence of old or acute infarction, mass lesion, hemorrhage, hydrocephalus or extra-axial collection. No skull or skull base lesion. Some inflammatory change of the paranasal sinuses, particularly the right maxillary sinus where there is circumferential mucosal thickening and some layering fluid. This could be symptomatic. After contrast administration, no abnormal brain or leptomeningeal enhancement occurs. No evidence of venous thrombosis. Superior sagittal sinus is patent and normal. Both  transverse sinuses and both sigmoid sinuses are normal. Both jugular veins show flow. Deep veins appear normal. No arterial pathology is identified. IMPRESSION: 1. Normal intracranial CT venography. No evidence of venous thrombosis. 2. Inflammatory disease of the paranasal sinuses, particularly the right maxillary sinus where there is circumferential mucosal thickening and some layering fluid. This could be symptomatic. 3. The brain itself appears normal. Electronically Signed   By: Paulina Fusi M.D.   On: 02/12/2020 09:42   US PELVIC COMPLETE W TRANSVAGINAL AND TORSION R/O  Result Date: 02/12/2020 CLINICAL DATA:  Vaginal bleeding and pelvic pain EXAM: TRANSABDOMINAL AND TRANSVAGINAL ULTRASOUND OF PELVIS DOPPLER ULTRASOUND OF OVARIES TECHNIQUE: Both transabdominal and transvaginal ultrasound examinations of the pelvis were performed. Transabdominal technique was performed for global imaging of the pelvis including uterus, ovaries, adnexal regions, and pelvic cul-de-sac. It was necessary to proceed with endovaginal exam following the transabdominal exam to visualize the ovaries. Color and duplex Doppler ultrasound was utilized to evaluate blood flow to the ovaries. COMPARISON:  None. FINDINGS: Uterus Measurements: 7.5 x 5.0 x 6.2 cm. = volume: 122 mL. No fibroids or other mass visualized. Endometrium Thickness: 20 mm.  Mildly heterogeneous. Right ovary Measurements: 4.2 x 2.4 x 3.2 cm. = volume: 17 mL. Normal appearance/no adnexal mass. Left ovary Measurements: 3.5 x 5.0 x 4.3 cm. = volume: 39 mL. 3.9 cm simple cyst is noted in the left ovary. Pulsed Doppler evaluation of both ovaries demonstrates normal low-resistance arterial and venous waveforms. Other findings No abnormal free fluid. IMPRESSION: Thickened heterogeneous endometrium with increased vascularity consistent with the patient's given clinical history of vaginal bleeding. Although this may be related to the current menstrual status the possibility of  an underlying endometrial abnormality deserves consideration given the hypervascularity and heterogeneous appearance. Further evaluation is warranted. Electronically Signed   By: Alcide Clever M.D.   On: 02/12/2020 13:01    Assessment/Plan Principal Problem:   Symptomatic anemia Active Problems:   Abnormal uterine bleeding   Symptomatic anemia: -Patient presented with abnormal uterine bleeding, lightheadedness, near syncope, headache and generalized weakness -H&H 6.9/23.4 was 8.8/28.8 yesterday.  Initial labs including TSH, PT/INR: WNL, troponin x2 -.  -Reviewed chest x-ray, CT venography, ultrasound of pelvis. -1 unit PRBC transfused in ED. -Check iron studies, B12, folate. -EDP consulted OB/GYN who recommended Premarin 25 mg IV once and Megace 40 mg twice daily. -Monitor H&H closely.  Repeat H&H after 4 hours of transfusion. -Follow-up with OB/GYN outpatient-May need endometrial biopsy -Tylenol/morphine as needed for abdominal pain.  Zofran as needed for nausea and vomiting.  Monitor vitals closely.  DVT prophylaxis: SCD, no chemical anticoagulation due to active bleeding Code Status: Full code Family Communication: None present at bedside.  Plan of care discussed with patient in length and she verbalized understanding and agreed with it. Disposition Plan: Likely home tomorrow a.m. Consults called: OB/GYN by  EDP  admission status: Observation   Ollen Bowl MD [facility]  If 7PM-7AM, please contact night-coverage [email or web address] Password [facility]  02/12/2020, 3:20 PM

## 2020-02-12 NOTE — ED Provider Notes (Signed)
[facility] EMERGENCY DEPARTMENT Provider Note   CSN: [id number] Arrival date & time: 02/11/20  1508     History Chief Complaint  Patient presents with  . Weakness  . Vaginal Bleeding    Vanessa Richmond is a 49 y.o. female.  The history is provided by the patient and medical records. The history is limited by a language barrier. A language interpreter was used.  Vaginal Bleeding Quality:  Dark red and clots Severity:  Severe Onset quality:  Gradual Duration:  2 months Timing:  Constant Progression:  Worsening Chronicity:  Recurrent Menstrual history:  Irregular Number of pads used:  8 today Possible pregnancy: no   Relieved by:  Nothing Worsened by:  Nothing Ineffective treatments:  None tried Associated symptoms: abdominal pain, fatigue and nausea   Associated symptoms: no back pain, no dizziness, no fever and no vaginal discharge   Associated symptoms comment:  Naer syncope  Risk factors: no gynecological surgery        History reviewed. No pertinent past medical history.  Patient Active Problem List   Diagnosis Date Noted  . Stiffness of joints, not elsewhere classified, multiple sites 06/07/2011  . ANEMIA-IRON DEFICIENCY 03/31/2010  . ANXIETY 03/31/2010  . GASTRITIS 03/31/2010  . WEIGHT LOSS 03/31/2010  . NAUSEA ALONE 03/31/2010  . ABDOMINAL PAIN-RUQ 03/31/2010  . ABDOMINAL PAIN-RLQ 03/31/2010  . ABDOMINAL PAIN, EPIGASTRIC 02/05/2010    History reviewed. No pertinent surgical history.   OB History   No obstetric history on file.     No family history on file.  Social History   Tobacco Use  . Smoking status: Never Smoker  Substance Use Topics  . Alcohol use: No  . Drug use: No    Home Medications Prior to Admission medications   Medication Sig Start Date End Date Taking? Authorizing Provider  cyclobenzaprine (FLEXERIL) 10 MG tablet Take 1 tablet (10 mg total) by mouth 2 (two) times daily as needed for muscle spasms.  10/24/12   Sunnie Nielsen, MD  HYDROcodone-acetaminophen (NORCO/VICODIN) 5-325 MG per tablet Take 2 tablets by mouth every 4 (four) hours as needed for pain. 10/24/12   Sunnie Nielsen, MD  ibuprofen (ADVIL,MOTRIN) 800 MG tablet Take 1 tablet (800 mg total) by mouth 3 (three) times daily. 10/24/12   Sunnie Nielsen, MD  omeprazole (PRILOSEC) 20 MG capsule Take 1 capsule (20 mg total) by mouth daily. 12/07/15   Benjiman Core, MD    Allergies    Patient has no known allergies.  Review of Systems   Review of Systems  Constitutional: Positive for diaphoresis and fatigue. Negative for chills and fever.  HENT: Negative for congestion.   Eyes: Positive for photophobia and visual disturbance.  Respiratory: Positive for cough. Negative for apnea, chest tightness and shortness of breath.   Cardiovascular: Positive for chest pain. Negative for palpitations and leg swelling.  Gastrointestinal: Positive for abdominal pain and nausea. Negative for constipation, diarrhea and vomiting.  Genitourinary: Positive for vaginal bleeding. Negative for frequency, vaginal discharge and vaginal pain.  Musculoskeletal: Negative for back pain, neck pain and neck stiffness.  Skin: Negative for rash and wound.  Neurological: Positive for numbness and headaches. Negative for dizziness, weakness and light-headedness.  Psychiatric/Behavioral: Negative for agitation and confusion.    Physical Exam Updated Vital Signs BP (!) 125/31 (BP Location: Right Arm)   Pulse 80   Temp (!) 97.3 F (36.3 C) (Oral)   Resp 16   SpO2 100%   Physical Exam Vitals and nursing  note reviewed. Exam conducted with a chaperone present.  Constitutional:      General: She is not in acute distress.    Appearance: She is well-developed. She is not ill-appearing, toxic-appearing or diaphoretic.  HENT:     Head: Normocephalic and atraumatic.     Nose: Nose normal. No congestion or rhinorrhea.     Mouth/Throat:     Mouth: Mucous membranes are  moist.     Pharynx: No oropharyngeal exudate or posterior oropharyngeal erythema.  Eyes:     Extraocular Movements: Extraocular movements intact.     Conjunctiva/sclera: Conjunctivae normal.     Pupils: Pupils are equal, round, and reactive to light.  Cardiovascular:     Rate and Rhythm: Normal rate and regular rhythm.     Pulses: Normal pulses.     Heart sounds: No murmur heard.   Pulmonary:     Effort: Pulmonary effort is normal. No respiratory distress.     Breath sounds: Normal breath sounds. No wheezing, rhonchi or rales.  Chest:     Chest wall: No tenderness.  Abdominal:     General: Abdomen is flat.     Palpations: Abdomen is soft.     Tenderness: There is abdominal tenderness. There is no right CVA tenderness, left CVA tenderness, guarding or rebound.  Genitourinary:    Cervix: Cervical motion tenderness and cervical bleeding present.     Uterus: Tender.      Adnexa:        Right: Tenderness present.      Rectum: Tenderness present.  Musculoskeletal:     Cervical back: Neck supple.  Skin:    General: Skin is warm and dry.  Neurological:     Mental Status: She is alert.     GCS: GCS eye subscore is 4. GCS verbal subscore is 5. GCS motor subscore is 6.     Cranial Nerves: No dysarthria or facial asymmetry.     Sensory: Sensory deficit present.     Motor: No weakness or abnormal muscle tone.     Coordination: Coordination normal. Finger-Nose-Finger Test normal.     Comments: Numbness in left face, left arm, and left leg.  Otherwise normal neurologic exam.  Reports blurry vision but no double vision.  Normal extraocular movements.  Symmetric smile.  Psychiatric:        Mood and Affect: Mood normal.     ED Results / Procedures / Treatments   Labs (all labs ordered are listed, but only abnormal results are displayed) Labs Reviewed  COMPREHENSIVE METABOLIC PANEL - Abnormal; Notable for the following components:      Result Value   Glucose, Bld 107 (*)    Albumin 3.4  (*)    Total Bilirubin 0.2 (*)    All other components within normal limits  CBC - Abnormal; Notable for the following components:   RBC 3.45 (*)    Hemoglobin 8.5 (*)    HCT 28.9 (*)    MCH 24.6 (*)    MCHC 29.4 (*)    RDW 17.3 (*)    Platelets 467 (*)    All other components within normal limits  CBC - Abnormal; Notable for the following components:   RBC 2.80 (*)    Hemoglobin 6.9 (*)    HCT 23.4 (*)    MCH 24.6 (*)    MCHC 29.5 (*)    RDW 17.4 (*)    All other components within normal limits  IRON AND TIBC - Abnormal; Notable for the  following components:   Iron 27 (*)    TIBC 490 (*)    Saturation Ratios 6 (*)    All other components within normal limits  FERRITIN - Abnormal; Notable for the following components:   Ferritin 6 (*)    All other components within normal limits  URINE CULTURE  SARS CORONAVIRUS 2 BY RT PCR (HOSPITAL ORDER, PERFORMED IN [facility])  TSH  PROTIME-INR  HIV ANTIBODY (ROUTINE TESTING W REFLEX)  MAGNESIUM  PHOSPHORUS  VITAMIN B12  FOLATE  URINALYSIS, COMPLETE (UACMP) WITH MICROSCOPIC  CBC  CBC  COMPREHENSIVE METABOLIC PANEL  I-STAT BETA HCG BLOOD, ED ([facility], [facility], [facility] ONLY)  TYPE AND SCREEN  ABO/RH  PREPARE RBC (CROSSMATCH)  TROPONIN I (HIGH SENSITIVITY)  TROPONIN I (HIGH SENSITIVITY)    EKG None  Radiology DG Chest 2 View  Result Date: 02/12/2020 CLINICAL DATA:  Shortness of breath and cough.  Chest pain. EXAM: CHEST - 2 VIEW COMPARISON:  December 07, 2015 FINDINGS: Lungs are clear. Heart size and pulmonary vascularity are normal. No adenopathy. No bone lesions. No pneumothorax. IMPRESSION: Lungs clear.  Cardiac silhouette normal. Electronically Signed   By: Bretta Bang III M.D.   On: 02/12/2020 09:20   CT VENOGRAM HEAD  Result Date: 02/12/2020 CLINICAL DATA:  Dural venous thrombosis suspected. Headache and blurred vision. Left-sided numbness. EXAM: CT VENOGRAM HEAD TECHNIQUE: Head CT was performed followed by CT  venography. CONTRAST:  75mL OMNIPAQUE IOHEXOL 300 MG/ML  SOLN COMPARISON:  Head CT 08/20/2010 FINDINGS: Brain has normal appearance without evidence of old or acute infarction, mass lesion, hemorrhage, hydrocephalus or extra-axial collection. No skull or skull base lesion. Some inflammatory change of the paranasal sinuses, particularly the right maxillary sinus where there is circumferential mucosal thickening and some layering fluid. This could be symptomatic. After contrast administration, no abnormal brain or leptomeningeal enhancement occurs. No evidence of venous thrombosis. Superior sagittal sinus is patent and normal. Both transverse sinuses and both sigmoid sinuses are normal. Both jugular veins show flow. Deep veins appear normal. No arterial pathology is identified. IMPRESSION: 1. Normal intracranial CT venography. No evidence of venous thrombosis. 2. Inflammatory disease of the paranasal sinuses, particularly the right maxillary sinus where there is circumferential mucosal thickening and some layering fluid. This could be symptomatic. 3. The brain itself appears normal. Electronically Signed   By: Paulina Fusi M.D.   On: 02/12/2020 09:42   US PELVIC COMPLETE W TRANSVAGINAL AND TORSION R/O  Result Date: 02/12/2020 CLINICAL DATA:  Vaginal bleeding and pelvic pain EXAM: TRANSABDOMINAL AND TRANSVAGINAL ULTRASOUND OF PELVIS DOPPLER ULTRASOUND OF OVARIES TECHNIQUE: Both transabdominal and transvaginal ultrasound examinations of the pelvis were performed. Transabdominal technique was performed for global imaging of the pelvis including uterus, ovaries, adnexal regions, and pelvic cul-de-sac. It was necessary to proceed with endovaginal exam following the transabdominal exam to visualize the ovaries. Color and duplex Doppler ultrasound was utilized to evaluate blood flow to the ovaries. COMPARISON:  None. FINDINGS: Uterus Measurements: 7.5 x 5.0 x 6.2 cm. = volume: 122 mL. No fibroids or other mass  visualized. Endometrium Thickness: 20 mm.  Mildly heterogeneous. Right ovary Measurements: 4.2 x 2.4 x 3.2 cm. = volume: 17 mL. Normal appearance/no adnexal mass. Left ovary Measurements: 3.5 x 5.0 x 4.3 cm. = volume: 39 mL. 3.9 cm simple cyst is noted in the left ovary. Pulsed Doppler evaluation of both ovaries demonstrates normal low-resistance arterial and venous waveforms. Other findings No abnormal free fluid. IMPRESSION: Thickened heterogeneous endometrium with  increased vascularity consistent with the patient's given clinical history of vaginal bleeding. Although this may be related to the current menstrual status the possibility of an underlying endometrial abnormality deserves consideration given the hypervascularity and heterogeneous appearance. Further evaluation is warranted. Electronically Signed   By: Alcide Clever M.D.   On: 02/12/2020 13:01    Procedures Procedures (including critical care time)  CRITICAL CARE Performed by: Canary Brim [place] Total critical care time: 35 minutes Critical care time was exclusive of separately billable procedures and treating other patients. Critical care was necessary to treat or prevent imminent or life-threatening deterioration. Critical care was time spent personally by me on the following activities: development of treatment plan with patient and/or surrogate as well as nursing, discussions with consultants, evaluation of patient's response to treatment, examination of patient, obtaining history from patient or surrogate, ordering and performing treatments and interventions, ordering and review of laboratory studies, ordering and review of radiographic studies, pulse oximetry and re-evaluation of patient's condition.   Medications Ordered in ED Medications  0.9 %  sodium chloride infusion (has no administration in time range)  conjugated estrogens (PREMARIN) injection 25 mg (has no administration in time range)  megestrol (MEGACE) tablet 40 mg  (40 mg Oral Given 02/12/20 1650)  acetaminophen (TYLENOL) tablet 650 mg (650 mg Oral Given 02/12/20 1650)    Or  acetaminophen (TYLENOL) suppository 650 mg ( Rectal See Alternative 02/12/20 1650)  ondansetron (ZOFRAN) tablet 4 mg (has no administration in time range)    Or  ondansetron (ZOFRAN) injection 4 mg (has no administration in time range)  morphine 2 MG/ML injection 2 mg (has no administration in time range)  sodium chloride 0.9 % bolus 1,000 mL (0 mLs Intravenous Stopped 02/12/20 1056)  prochlorperazine (COMPAZINE) injection 10 mg (10 mg Intravenous Given 02/12/20 0959)  diphenhydrAMINE (BENADRYL) injection 25 mg (25 mg Intravenous Given 02/12/20 0959)  iohexol (OMNIPAQUE) 300 MG/ML solution 75 mL (75 mLs Intravenous Contrast Given 02/12/20 0934)    ED Course  I have reviewed the triage vital signs and the nursing notes.  Pertinent labs & imaging results that were available during my care of the patient were reviewed by me and considered in my medical decision making (see chart for details).    MDM Rules/Calculators/A&P                          Vanessa Richmond is a 49 y.o. female with a past medical history significant for iron deficiency anemia, anxiety, prior gastritis, who presents with multiple complaints including headache, left-sided numbness, blurry vision, near syncope with lightheadedness, fatigue, muscle aches, and significant vaginal bleeding.  Patient reports that for the last for 5 months she has been having symptoms of left-sided numbness that has been persistent.  She also reports the left side of her body feels "tired".  She says that she is also had persistent blurry vision but denies diplopia.  She reports that she has been having headaches and this is been ongoing ever since she received her Covid vaccination.  She says that previously she has had heavy bleeding and is having irregular menstrual cycles.  She reports that her last 3 months, she has had nearly constant vaginal  bleeding and reports up to 8 pads being used today with clotting and vaginal bleeding.  She has seen OB/GYN previously and was given birth control medications.  She says that this is the worst it has ever been.  She reports  he is having some pain in her lower abdomen diffusely.  She denies significant nausea, vomiting, constipation, diarrhea, or any trauma.  She does report urinary frequency but denies dysuria.  She denies any shortness of breath or palpitations but does report some cough and some chest pain.  She thinks she may have a clotting or bleeding disorder but she does not know the details.  On exam, lungs are clear and chest is nontender.  Abdomen is nontender.  Patient had pelvic tenderness on pelvic exam with a chaperone.  She has no cervical motion and bilateral adnexal tenderness.  There was moderate vaginal bleeding with clots coming from her cervical os.  She also had numbness in her left face, left arm, left leg compared to the right side.  I did not appreciate weakness or coordination difficulty with finger-nose-finger testing.  No rashes were seen.  No tenderness on her flanks or back.  No nuchal rigidity or neck tenderness.  Clinically I am concerned about several things.  I am concerned about symptomatic anemia due to significant vaginal bleeding causing her to have near syncope lightheadedness episodes.  I am also concerned that her anemia may contribute to her headaches as she think she may have had some headache in the past when she was anemic.  However, with the neurologic deficit she is experiencing in relation to a prolonged and atypical headache, I feel need to rule out a dural venous sinus thrombosis as well.  We will get CT and CTV of her head to look for this as well as give her headache cocktail.  She did report some photophobia so it may be a migrainous component to this.  We will give her some fluids.  We will also check other labs and imaging to rule out other sources of  infection causing her fatigue.  With her frequency will get urinalysis and with a cough with chest x-ray.  She has been in emergency department for over 17 hours while waiting for initial evaluation.  We will check a repeat CBC to look for downtrending hemoglobin while in the emergency department given the continued vaginal bleeding here.  Anticipate reassessment for work-up.  Patient reports headache is improved after headache cocktail.  Her repeat hemoglobin has dropped down to 6.9 down from 8.5.  She is still having vaginal bleeding.  The pelvic ultrasound was performed after her pregnancy test was negative and does not show any evidence of torsion or fibroids.  It shows she has a vascular endometrium likely the source of her bleeding.  The radiologist comments that further evaluation is warranted.  CT venogram did not show any evidence intracranial abnormality in regards to stroke, thrombosis, or tumor or bleeding.  Patient reports her headache has improved.  Chest x-ray did not show any significant normality.  Given the patient's continued fatigue, lightheadedness, and downtrending hemoglobin with still active vaginal bleeding, I will call the OB/GYN team for recommendations for the continued vaginal bleeding as well as giving her some blood.  Anticipate he will need admission for further monitoring of her symptomatic anemia.  We will check her for Covid.  2:25 PM I called OB/GYN team and spoke with Dr. Debroah LoopArnold who reviewed the case.  He requested she get 25 mg of IV Premarin and 40 mg twice daily of Megace to try and stop the bleeding.  They agreed with a medicine admission for blood administration and trending of hemoglobins.  If the patient became unstable or had worsened bleeding, they are  available to be on-call for further evaluation and management.  We will give the patient blood and admit for further management.   Final Clinical Impression(s) / ED Diagnoses Final diagnoses:  Abnormal  vaginal bleeding  Near syncope  Symptomatic anemia    Rx / DC Orders ED Discharge Orders    None      Clinical Impression: 1. Abnormal vaginal bleeding   2. Pelvic pain   3. Near syncope   4. Symptomatic anemia     Disposition: Admit  This note was prepared with assistance of Dragon voice recognition software. Occasional wrong-word or sound-a-like substitutions may have occurred due to the inherent limitations of voice recognition software.     Bryanne Riquelme, Canary Brim, MD 02/12/20 1750

## 2020-02-12 NOTE — ED Notes (Signed)
Dinner tray delivered.

## 2020-02-12 NOTE — ED Notes (Signed)
Per Pahwani MD, restart blood infusion at 1723 at slower rate

## 2020-02-12 NOTE — ED Notes (Signed)
Patient transported to X-ray 

## 2020-02-12 NOTE — ED Notes (Signed)
Per Pahwani MD, hold blood transfusion at this time and reassess in 10-15 mins. Pt started c/o lower back pain and shob. Notified provider. PRN tylenol to be given. Will continue to monitor.

## 2020-02-12 NOTE — ED Notes (Signed)
Blood finished infusing from bag at this time, saline infusing at this time to flush line of remaining blood

## 2020-02-12 NOTE — ED Notes (Signed)
Pt reports back pain is now 7/10 from 9/10 and reports slight improvement in shob. Notified Pahwani MD

## 2020-02-13 DIAGNOSIS — D62 Acute posthemorrhagic anemia: Secondary | ICD-10-CM | POA: Diagnosis present

## 2020-02-13 LAB — URINALYSIS, COMPLETE (UACMP) WITH MICROSCOPIC
Bilirubin Urine: NEGATIVE
Glucose, UA: NEGATIVE mg/dL
Ketones, ur: NEGATIVE mg/dL
Leukocytes,Ua: NEGATIVE
Nitrite: NEGATIVE
Protein, ur: NEGATIVE mg/dL
RBC / HPF: >50 RBC/hpf (ref 0–5)
Specific Gravity, Urine: 1.025 (ref 1.005–1.030)
WBC, UA: NONE SEEN WBC/hpf (ref 0–5)
pH: 7 (ref 5.0–8.0)

## 2020-02-13 LAB — TYPE AND SCREEN
ABO/RH(D): O NEG
Antibody Screen: NEGATIVE
Unit division: 0

## 2020-02-13 LAB — COMPREHENSIVE METABOLIC PANEL
ALT: 15 U/L (ref 0–44)
AST: 18 U/L (ref 15–41)
Albumin: 2.9 g/dL — ABNORMAL LOW (ref 3.5–5.0)
Alkaline Phosphatase: 64 U/L (ref 38–126)
Anion gap: 10 (ref 5–15)
BUN: 11 mg/dL (ref 6–20)
CO2: 22 mmol/L (ref 22–32)
Calcium: 8.6 mg/dL — ABNORMAL LOW (ref 8.9–10.3)
Chloride: 107 mmol/L (ref 98–111)
Creatinine, Ser: 0.73 mg/dL (ref 0.44–1.00)
GFR calc Af Amer: >60 mL/min (ref >60)
GFR calc non Af Amer: >60 mL/min (ref >60)
Glucose, Bld: 110 mg/dL — ABNORMAL HIGH (ref 70–99)
Potassium: 3.3 mmol/L — ABNORMAL LOW (ref 3.5–5.1)
Sodium: 139 mmol/L (ref 135–145)
Total Bilirubin: 0.5 mg/dL (ref 0.3–1.2)
Total Protein: 5.5 g/dL — ABNORMAL LOW (ref 6.5–8.1)

## 2020-02-13 LAB — CBC
HCT: 26.4 % — ABNORMAL LOW (ref 36.0–46.0)
Hemoglobin: 8.4 g/dL — ABNORMAL LOW (ref 12.0–15.0)
MCH: 25.8 pg — ABNORMAL LOW (ref 26.0–34.0)
MCHC: 31.8 g/dL (ref 30.0–36.0)
MCV: 81.2 fL (ref 80.0–100.0)
Platelets: 413 10*3/uL — ABNORMAL HIGH (ref 150–400)
RBC: 3.25 MIL/uL — ABNORMAL LOW (ref 3.87–5.11)
RDW: 16.8 % — ABNORMAL HIGH (ref 11.5–15.5)
WBC: 8.9 10*3/uL (ref 4.0–10.5)
nRBC: 0 % (ref 0.0–0.2)

## 2020-02-13 LAB — HEMOGLOBIN AND HEMATOCRIT, BLOOD
HCT: 28.8 % — ABNORMAL LOW (ref 36.0–46.0)
Hemoglobin: 9.1 g/dL — ABNORMAL LOW (ref 12.0–15.0)

## 2020-02-13 LAB — BPAM RBC
Blood Product Expiration Date: 202109182359
ISSUE DATE / TIME: 202109151454
Unit Type and Rh: 9500

## 2020-02-13 LAB — URINE CULTURE

## 2020-02-13 MED ORDER — SODIUM CHLORIDE 0.9 % IV SOLN
510.0000 mg | Freq: Once | INTRAVENOUS | Status: AC
  Administered 2020-02-13: 510 mg via INTRAVENOUS
  Filled 2020-02-13: qty 17

## 2020-02-13 MED ORDER — MUPIROCIN 2 % EX OINT
TOPICAL_OINTMENT | CUTANEOUS | Status: AC
  Filled 2020-02-13: qty 22

## 2020-02-13 NOTE — TOC Initial Note (Signed)
Transition of Care [facility]) - Initial/Assessment Note    Patient Details  Name: Vanessa Richmond MRN: [id number] Date of Birth: [birth year]  [facility] of [facility]) CM/SW Contact:    Kingsley Plan, RN Phone Number: 02/13/2020, 11:00 AM  Clinical Narrative:                 Patient from home, no insurance, no PCP.   Scheduled follow up with [name] February 25, 2020 at 0930.   Scheduled follow up with GYN Dr Vergie Living March 09, 2020 at 0935 (first available).   Placed on AVS.   Changed to [facility]. Will assess need for MATCH (medication assistance) once discharging prescriptions determined.  Will continue to follow.  Expected Discharge Plan: Home/Self Care Barriers to Discharge: Continued Medical Work up   Patient Goals and CMS Choice Patient states their goals for this hospitalization and ongoing recovery are:: to return to home CMS Medicare.gov Compare Post Acute Care list provided to:: Patient Choice offered to / list presented to : Patient  Expected Discharge Plan and Services Expected Discharge Plan: Home/Self Care In-house Referral: Financial Counselor, PCP / [facility] Discharge Planning Services: Indigent Health Clinic, MATCH Program, Medication Assistance   Living arrangements for the past 2 months: Single Family Home                 DME Arranged: N/A DME Agency: NA       HH Arranged: NA          Prior Living Arrangements/Services Living arrangements for the past 2 months: Single Family Home Lives with:: Significant Other Patient language and need for interpreter reviewed:: Yes Do you feel safe going back to the place where you live?: Yes      Need for Family Participation in Patient Care: Yes (Comment) Care giver support system in place?: Yes (comment)   Criminal Activity/Legal Involvement Pertinent to Current Situation/Hospitalization: No - Comment as needed  Activities of Daily Living Home Assistive Devices/Equipment: None ADL  Screening (condition at time of admission) Patient's cognitive ability adequate to safely complete daily activities?: Yes Is the patient deaf or have difficulty hearing?: No Does the patient have difficulty seeing, even when wearing glasses/contacts?: No Does the patient have difficulty concentrating, remembering, or making decisions?: No Patient able to express need for assistance with ADLs?: Yes Does the patient have difficulty dressing or bathing?: No Independently performs ADLs?: Yes (appropriate for developmental age) Does the patient have difficulty walking or climbing stairs?: No Weakness of Legs: None Weakness of Arms/Hands: None  Permission Sought/Granted   Permission granted to share information with : No              Emotional Assessment Appearance:: Appears stated age     Orientation: : Oriented to Self, Oriented to Place, Oriented to  Time, Oriented to Situation Alcohol / Substance Use: Not Applicable Psych Involvement: No (comment)  Admission diagnosis:  Abnormal vaginal bleeding [N93.9] Near syncope [R55] Pelvic pain [R10.2] Symptomatic anemia [D64.9] Patient Active Problem List   Diagnosis Date Noted  . Symptomatic anemia 02/12/2020  . Abnormal uterine bleeding 02/12/2020  . Stiffness of joints, not elsewhere classified, multiple sites 06/07/2011  . ANEMIA-IRON DEFICIENCY 03/31/2010  . ANXIETY 03/31/2010  . GASTRITIS 03/31/2010  . WEIGHT LOSS 03/31/2010  . NAUSEA ALONE 03/31/2010  . ABDOMINAL PAIN-RUQ 03/31/2010  . ABDOMINAL PAIN-RLQ 03/31/2010  . ABDOMINAL PAIN, EPIGASTRIC 02/05/2010   PCP:  Patient, No Pcp Per Pharmacy:   [facility] [phone] -  [place], [place] - [address] AT [facility] OF [facility] & [address] [place] [address] Phone: [phone] Fax: [phone]  [facility] [place], [place] - [address] [place] [address] Phone: [phone] Fax:  [phone]     Social Determinants of Health (SDOH) Interventions    Readmission Risk Interventions No flowsheet data found.

## 2020-02-13 NOTE — Progress Notes (Signed)
PROGRESS NOTE    KAYLEEN ALIG  XBL:[id number] DOB: [birth year] DOA: 02/11/2020 PCP: Patient, No Pcp Per    Brief Narrative:  CANDIACE WEST is a 49 year old Spanish-speaking female with past medical history notable for iron deficiency anemia, obesity who presented to the emergency department with heavy vaginal bleeding that has been ongoing for 1.5 months.  She reports she has been passing clots frequently utilizing 8-9 pads per day with symptoms associated with crampy lower abdominal pain, generalized weakness, fatigue, lightheadedness, headache and blurred vision.  No history of bleeding disorder, not on a blood thinner, Pap smear 6 months prior was negative.  She is G3 P3 with normal vaginal deliveries and no progress history of STDs.  In the ED, patient waited for more than 24 hours and left without being seen and then eventually returned.  Hemoglobin was noted to be 6.9 which was down from 8.8 the day prior.  TSH, INR within normal limits.  Covid-19 -.  Chest x-ray negative for acute cardiopulmonary findings.  Pelvic ultrasound showed thickened heterogeneous endometrium with increased vascularity consistent with the patient's clinical history of vaginal bleeding.  Patient was transfused 1 unit PRBC.  ED physician discussed with on-call OB/GYN, Dr. Debroah Loop who recommended Premarin 25 mg IV once followed by Megace 40 mg p.o. twice daily.  [facility] consulted for admission for symptomatic anemia.   Assessment & Plan:   Principal Problem:   Symptomatic anemia Active Problems:   Abnormal uterine bleeding  Symptomatic anemia secondary to abnormal uterine bleeding/menorrhagia Patient presenting to the ED with continued vaginal bleeding, utilizing 8-9 pads per day with associated dizziness, lightheadedness, and generalized weakness.  Patient was noted to have a hemoglobin of 6.9 which is down from 8.8 the day prior.  TSH, INR within normal limits.  Pelvic ultrasound notable for thickened heterogeneous  endometrium with increased vascularity that is consistent with the patient's history of vaginal bleeding.  Patient was transfused 1 unit PRBC on 02/12/2020 with appropriate rise of hemoglobin to 8.6. --Hgb 8.8>6.9>8.6>8.4 --Continue to monitor hemoglobin every 12 hours --Transfuse for hemoglobin less than 7.0 or if vaginal bleeding becomes more brisk --Continue Megace 40 mg p.o. twice daily --Outpatient follow-up with GYN arranged on March 09, 2020  Iron deficiency anemia Iron studies notable for iron 27, TIBC elevated at 490, ferritin 6, folate 17.9, B12 197.  Etiology likely from persistent vaginal bleeding as above. --IV Feraheme today --Continue to monitor hemoglobin as above --Likely will place on iron supplements on discharge  DVT prophylaxis: SCDs, no chemical anticoagulation due to active bleeding Code Status: Full code Family Communication: No family present at bedside, plan of care discussed with patient at length in which she verbalized understanding with assistance of video interpreter  Disposition Plan:  Status is: Observation  The patient remains OBS appropriate and will d/c before 2 midnights.  Dispo: The patient is from: Home              Anticipated d/c is to: Home              Anticipated d/c date is: 1 day              Patient currently is not medically stable to d/c.  Consultants:   GYN, Dr. Debroah Loop - discussed via telephone by ED physician  Procedures:   None  Antimicrobials:   None   Subjective: Patient seen and examined bedside, resting comfortably.  Assisted with aid of Spanish video interpreter Maralyn Sago ID# [phone].  Patient continues  with vaginal bleeding, slightly decreased since yesterday.  She reports over the past 1.5 months she has been going through 8-9 pads per day, now down to 4-5.  Hemoglobin appears to have stabilized after transfusion overnight, initially 6.9 to 8.6, repeat this morning 8.4.  Discussed with patient regarding low iron levels  and recommended IV iron transfusion which she was in agreement.  Continues with some mild weakness/fatigue.  No other complaints or concerns at this time.  Denies headache, no chest pain, no palpitations, no shortness of breath, no abdominal pain.  No acute events overnight per nursing staff.  Objective: Vitals:   02/12/20 1946 02/13/20 0509 02/13/20 0812 02/13/20 1034  BP: 135/84 111/64    Pulse: 79 80    Resp:  18    Temp: 98 F (36.7 C) 98 F (36.7 C)    TempSrc: Oral Oral    SpO2: 98% 97%    Weight:  81.3 kg 81.7 kg   Height:    5' 1.02" (1.55 m)    Intake/Output Summary (Last 24 hours) at 02/13/2020 1131 Last data filed at 02/12/2020 2222 Gross per 24 hour  Intake 582.92 ml  Output --  Net 582.92 ml   Filed Weights   02/13/20 0509 02/13/20 0812  Weight: 81.3 kg 81.7 kg    Examination:  General exam: Appears calm and comfortable  Respiratory system: Clear to auscultation. Respiratory effort normal.  Oxygen well on room air Cardiovascular system: S1 & S2 heard, RRR. No JVD, murmurs, rubs, gallops or clicks. No pedal edema. Gastrointestinal system: Abdomen is nondistended, soft and nontender. No organomegaly or masses felt. Normal bowel sounds heard. Central nervous system: Alert and oriented. No focal neurological deficits. Extremities: Symmetric 5 x 5 power. Skin: No rashes, lesions or ulcers Psychiatry: Judgement and insight appear normal. Mood & affect appropriate.     Data Reviewed: I have personally reviewed following labs and imaging studies  CBC: Recent Labs  Lab 02/10/20 1837 02/11/20 1601 02/12/20 0958 02/12/20 2133 02/13/20 0939  WBC 8.4 7.5 6.5 8.7 8.9  HGB 8.8* 8.5* 6.9* 8.6* 8.4*  HCT 28.8* 28.9* 23.4* 27.4* 26.4*  MCV 82.8 83.8 83.6 82.0 81.2  PLT 453* 467* 386 395 413*   Basic Metabolic Panel: Recent Labs  Lab 02/10/20 1837 02/11/20 1601 02/12/20 1606 02/13/20 0939  NA 136 137  --  139  K 3.7 3.8  --  3.3*  CL 103 104  --  107  CO2  23 24  --  22  GLUCOSE 89 107*  --  110*  BUN 8 11  --  11  CREATININE 0.70 0.77  --  0.73  CALCIUM 8.6* 8.9  --  8.6*  MG  --   --  1.7  --   PHOS  --   --  3.4  --    GFR: Estimated Creatinine Clearance: 80.6 mL/min (by C-G formula based on SCr of 0.73 mg/dL). Liver Function Tests: Recent Labs  Lab 02/11/20 1601 02/13/20 0939  AST 19 18  ALT 17 15  ALKPHOS 74 64  BILITOT 0.2* 0.5  PROT 6.5 5.5*  ALBUMIN 3.4* 2.9*   No results for input(s): LIPASE, AMYLASE in the last 168 hours. No results for input(s): AMMONIA in the last 168 hours. Coagulation Profile: Recent Labs  Lab 02/12/20 0958  INR 1.1   Cardiac Enzymes: No results for input(s): CKTOTAL, CKMB, CKMBINDEX, TROPONINI in the last 168 hours. BNP (last 3 results) No results for input(s): PROBNP in the  last 8760 hours. HbA1C: No results for input(s): HGBA1C in the last 72 hours. CBG: No results for input(s): GLUCAP in the last 168 hours. Lipid Profile: No results for input(s): CHOL, HDL, LDLCALC, TRIG, CHOLHDL, LDLDIRECT in the last 72 hours. Thyroid Function Tests: Recent Labs    02/12/20 0958  TSH 2.194   Anemia Panel: Recent Labs    02/12/20 1606  VITAMINB12 197  FOLATE 17.9  FERRITIN 6*  TIBC 490*  IRON 27*   Sepsis Labs: No results for input(s): PROCALCITON, LATICACIDVEN in the last 168 hours.  Recent Results (from the past 240 hour(s))  SARS Coronavirus 2 by RT PCR (hospital order, performed in [facility] hospital lab) Nasopharyngeal Nasopharyngeal Swab     Status: None   Collection Time: 02/12/20  4:33 PM   Specimen: Nasopharyngeal Swab  Result Value Ref Range Status   SARS Coronavirus 2 NEGATIVE NEGATIVE Final    Comment: (NOTE) SARS-CoV-2 target nucleic acids are NOT DETECTED.  The SARS-CoV-2 RNA is generally detectable in upper and lower respiratory specimens during the acute phase of infection. The lowest concentration of SARS-CoV-2 viral copies this assay can detect is 250 copies /  mL. A negative result does not preclude SARS-CoV-2 infection and should not be used as the sole basis for treatment or other patient management decisions.  A negative result may occur with improper specimen collection / handling, submission of specimen other than nasopharyngeal swab, presence of viral mutation(s) within the areas targeted by this assay, and inadequate number of viral copies (<250 copies / mL). A negative result must be combined with clinical observations, patient history, and epidemiological information.  Fact Sheet for Patients:   BoilerBrush.com.cy  Fact Sheet for Healthcare Providers: https://pope.com/  This test is not yet approved or  cleared by the [place] FDA and has been authorized for detection and/or diagnosis of SARS-CoV-2 by FDA under an Emergency Use Authorization (EUA).  This EUA will remain in effect (meaning this test can be used) for the duration of the COVID-19 declaration under Section 564(b)(1) of the Act, 21 U.S.C. section 360bbb-3(b)(1), unless the authorization is terminated or revoked sooner.  Performed at [facility], [address] [place], [place] [address]          Radiology Studies: DG Chest 2 View  Result Date: 02/12/2020 CLINICAL DATA:  Shortness of breath and cough.  Chest pain. EXAM: CHEST - 2 VIEW COMPARISON:  December 07, 2015 FINDINGS: Lungs are clear. Heart size and pulmonary vascularity are normal. No adenopathy. No bone lesions. No pneumothorax. IMPRESSION: Lungs clear.  Cardiac silhouette normal. Electronically Signed   By: Bretta Bang III M.D.   On: 02/12/2020 09:20   CT VENOGRAM HEAD  Result Date: 02/12/2020 CLINICAL DATA:  Dural venous thrombosis suspected. Headache and blurred vision. Left-sided numbness. EXAM: CT VENOGRAM HEAD TECHNIQUE: Head CT was performed followed by CT venography. CONTRAST:  50mL OMNIPAQUE IOHEXOL 300 MG/ML  SOLN COMPARISON:  Head CT  08/20/2010 FINDINGS: Brain has normal appearance without evidence of old or acute infarction, mass lesion, hemorrhage, hydrocephalus or extra-axial collection. No skull or skull base lesion. Some inflammatory change of the paranasal sinuses, particularly the right maxillary sinus where there is circumferential mucosal thickening and some layering fluid. This could be symptomatic. After contrast administration, no abnormal brain or leptomeningeal enhancement occurs. No evidence of venous thrombosis. Superior sagittal sinus is patent and normal. Both transverse sinuses and both sigmoid sinuses are normal. Both jugular veins show flow. Deep veins  appear normal. No arterial pathology is identified. IMPRESSION: 1. Normal intracranial CT venography. No evidence of venous thrombosis. 2. Inflammatory disease of the paranasal sinuses, particularly the right maxillary sinus where there is circumferential mucosal thickening and some layering fluid. This could be symptomatic. 3. The brain itself appears normal. Electronically Signed   By: Paulina Fusi M.D.   On: 02/12/2020 09:42   US PELVIC COMPLETE W TRANSVAGINAL AND TORSION R/O  Result Date: 02/12/2020 CLINICAL DATA:  Vaginal bleeding and pelvic pain EXAM: TRANSABDOMINAL AND TRANSVAGINAL ULTRASOUND OF PELVIS DOPPLER ULTRASOUND OF OVARIES TECHNIQUE: Both transabdominal and transvaginal ultrasound examinations of the pelvis were performed. Transabdominal technique was performed for global imaging of the pelvis including uterus, ovaries, adnexal regions, and pelvic cul-de-sac. It was necessary to proceed with endovaginal exam following the transabdominal exam to visualize the ovaries. Color and duplex Doppler ultrasound was utilized to evaluate blood flow to the ovaries. COMPARISON:  None. FINDINGS: Uterus Measurements: 7.5 x 5.0 x 6.2 cm. = volume: 122 mL. No fibroids or other mass visualized. Endometrium Thickness: 20 mm.  Mildly heterogeneous. Right ovary Measurements:  4.2 x 2.4 x 3.2 cm. = volume: 17 mL. Normal appearance/no adnexal mass. Left ovary Measurements: 3.5 x 5.0 x 4.3 cm. = volume: 39 mL. 3.9 cm simple cyst is noted in the left ovary. Pulsed Doppler evaluation of both ovaries demonstrates normal low-resistance arterial and venous waveforms. Other findings No abnormal free fluid. IMPRESSION: Thickened heterogeneous endometrium with increased vascularity consistent with the patient's given clinical history of vaginal bleeding. Although this may be related to the current menstrual status the possibility of an underlying endometrial abnormality deserves consideration given the hypervascularity and heterogeneous appearance. Further evaluation is warranted. Electronically Signed   By: Alcide Clever M.D.   On: 02/12/2020 13:01        Scheduled Meds: . megestrol  40 mg Oral BID   Continuous Infusions:   LOS: 0 days    Time spent: 36 minutes spent on chart review, discussion with nursing staff, consultants, updating family and interview/physical exam; more than 50% of that time was spent in counseling and/or coordination of care.    Alvira Philips [place], DO [facility] Available via Epic secure chat 7am-7pm After these hours, please refer to coverage provider listed on [email or web address] 02/13/2020, 11:31 AM

## 2020-02-14 LAB — CBC
HCT: 28.7 % — ABNORMAL LOW (ref 36.0–46.0)
Hemoglobin: 9.2 g/dL — ABNORMAL LOW (ref 12.0–15.0)
MCH: 26.4 pg (ref 26.0–34.0)
MCHC: 32.1 g/dL (ref 30.0–36.0)
MCV: 82.5 fL (ref 80.0–100.0)
Platelets: 422 10*3/uL — ABNORMAL HIGH (ref 150–400)
RBC: 3.48 MIL/uL — ABNORMAL LOW (ref 3.87–5.11)
RDW: 17.4 % — ABNORMAL HIGH (ref 11.5–15.5)
WBC: 11.2 10*3/uL — ABNORMAL HIGH (ref 4.0–10.5)
nRBC: 0 % (ref 0.0–0.2)

## 2020-02-14 LAB — BASIC METABOLIC PANEL
Anion gap: 6 (ref 5–15)
BUN: 10 mg/dL (ref 6–20)
CO2: 22 mmol/L (ref 22–32)
Calcium: 8.4 mg/dL — ABNORMAL LOW (ref 8.9–10.3)
Chloride: 106 mmol/L (ref 98–111)
Creatinine, Ser: 0.75 mg/dL (ref 0.44–1.00)
GFR calc Af Amer: >60 mL/min (ref >60)
GFR calc non Af Amer: >60 mL/min (ref >60)
Glucose, Bld: 91 mg/dL (ref 70–99)
Potassium: 3.5 mmol/L (ref 3.5–5.1)
Sodium: 134 mmol/L — ABNORMAL LOW (ref 135–145)

## 2020-02-14 MED ORDER — ALUM & MAG HYDROXIDE-SIMETH 200-200-20 MG/5ML PO SUSP: 30.0000 mL | Freq: Four times a day (QID) | ORAL | Status: DC | PRN

## 2020-02-14 MED ORDER — FERROUS SULFATE 325 (65 FE) MG PO TABS
325.0000 mg | ORAL_TABLET | Freq: Two times a day (BID) | ORAL | Status: DC
  Administered 2020-02-14: 325 mg via ORAL
  Filled 2020-02-14: qty 1

## 2020-02-14 MED ORDER — FERROUS SULFATE 325 (65 FE) MG PO TABS: 325.0000 mg | ORAL_TABLET | Freq: Two times a day (BID) | ORAL | 0 refills | Status: AC

## 2020-02-14 MED ORDER — MEGESTROL ACETATE 40 MG PO TABS: 40.0000 mg | ORAL_TABLET | Freq: Two times a day (BID) | ORAL | 0 refills | Status: AC

## 2020-02-14 MED FILL — MEGESTROL 40 MG TABLET: 40 | 30 days supply | Qty: 60 | Fill #0

## 2020-02-14 MED FILL — FERROUS SULFATE 325 MG TAB: 325 (65 FE) | 30 days supply | Qty: 60 | Fill #0

## 2020-02-14 NOTE — Discharge Summary (Signed)
Physician Discharge Summary  Vanessa Richmond QIO:[id number] DOB: 05-May-1971 DOA: 02/11/2020  PCP: Patient, No Pcp Per  Admit date: 02/11/2020 Discharge date: 02/14/2020  Admitted From: Home Disposition: Home  Recommendations for Outpatient Follow-up:  1. Follow up with PCP, [name] scheduled on 02/25/2020 at 08 30 2. Follow-up with GYN, Dr. Vergie Living on 03/09/2020 at 09 30 3. Please obtain CBC in one week 4. Started on Megace 40 mg p.o. twice daily for menorrhagia 5. Started on ferrous sulfate 325 mg p.o. twice daily for iron deficiency anemia related to menorrhagia  Home Health: No Equipment/Devices: None  Discharge Condition: Stable CODE STATUS: Full code Diet recommendation: Regular diet  History of present illness:  Vanessa Richmond is a 49 year old Spanish-speaking female with past medical history notable for iron deficiency anemia, obesity who presented to the emergency department with heavy vaginal bleeding that has been ongoing for 1.5 months.  She reports she has been passing clots frequently utilizing 8-9 pads per day with symptoms associated with crampy lower abdominal pain, generalized weakness, fatigue, lightheadedness, headache and blurred vision.  No history of bleeding disorder, not on a blood thinner, Pap smear 6 months prior was negative.  She is G3 P3 with normal vaginal deliveries and no progress history of STDs.  In the ED, patient waited for more than 24 hours and left without being seen and then eventually returned.  Hemoglobin was noted to be 6.9 which was down from 8.8 the day prior.  TSH, INR within normal limits.  Covid-19 -.  Chest x-ray negative for acute cardiopulmonary findings.  Pelvic ultrasound showed thickened heterogeneous endometrium with increased vascularity consistent with the patient's clinical history of vaginal bleeding.  Patient was transfused 1 unit PRBC.  ED physician discussed with on-call OB/GYN, Dr. Debroah Loop who recommended Premarin 25 mg IV once  followed by Megace 40 mg p.o. twice daily.  [facility] consulted for admission for symptomatic anemia.  Hospital course:  Symptomatic anemia secondary to abnormal uterine bleeding/menorrhagia Patient presenting to the ED with continued vaginal bleeding, utilizing 8-9 pads per day with associated dizziness, lightheadedness, and generalized weakness.  Patient was noted to have a hemoglobin of 6.9 which is down from 8.8 the day prior.  TSH, INR within normal limits.  Pelvic ultrasound notable for thickened heterogeneous endometrium with increased vascularity that is consistent with the patient's history of vaginal bleeding.  Patient was transfused 1 unit PRBC on 02/12/2020 with appropriate rise of hemoglobin to 8.6.  Patient's hemoglobin remained stable and was 9.4 at time of discharge.  Continue Megace 40 mg p.o. twice daily.  Has outpatient GYN follow-up arranged on March 09, 2020 at 09 30 with Dr. Vergie Living.  Iron deficiency anemia Iron studies notable for iron 27, TIBC elevated at 490, ferritin 6, folate 17.9, B12 197.  Etiology likely from persistent vaginal bleeding as above.  Patient received 1 dose of IV Feraheme during her hospitalization and will continue ferrous sulfate 325 mg p.o. twice daily following discharge.  Discharge Diagnoses:  Principal Problem:   Symptomatic anemia Active Problems:   Abnormal uterine bleeding   Acute blood loss anemia    Discharge Instructions  Discharge Instructions    Call MD for:  difficulty breathing, headache or visual disturbances   Complete by: As directed    Call MD for:  extreme fatigue   Complete by: As directed    Call MD for:  persistant dizziness or light-headedness   Complete by: As directed    Call MD for:  persistant  nausea and vomiting   Complete by: As directed    Call MD for:  severe uncontrolled pain   Complete by: As directed    Call MD for:  temperature >100.4   Complete by: As directed    Diet - low sodium heart healthy    Complete by: As directed    Increase activity slowly   Complete by: As directed      Allergies as of 02/14/2020   No Known Allergies     Medication List    STOP taking these medications   Calcium-Magnesium-Zinc Tabs   cyclobenzaprine 10 MG tablet Commonly known as: FLEXERIL   HYDROcodone-acetaminophen 5-325 MG tablet Commonly known as: NORCO/VICODIN   ibuprofen 200 MG tablet Commonly known as: ADVIL   ibuprofen 800 MG tablet Commonly known as: ADVIL   omeprazole 20 MG capsule Commonly known as: PRILOSEC     TAKE these medications   ferrous sulfate 325 (65 FE) MG tablet Take 1 tablet (325 mg total) by mouth 2 (two) times daily with a meal. What changed:   medication strength  how much to take  when to take this   megestrol 40 MG tablet Commonly known as: MEGACE Take 1 tablet (40 mg total) by mouth 2 (two) times daily.       Follow-up Information    Grayce Sessions, NP Follow up.   Specialty: Internal Medicine Why: February 25, 2020 at 0930 am  Contact information: [address] [place] [address] [phone]        Tiskilwa Bing, MD Follow up.   Specialty: Obstetrics and Gynecology Why: March 09, 2020 at 0930  Contact information: [address] [place] [address] [phone]              No Known Allergies  Consultations:  GYN, Dr. Debroah Loop - discussed via telephone by ED physician   Procedures/Studies: DG Chest 2 View  Result Date: 02/12/2020 CLINICAL DATA:  Shortness of breath and cough.  Chest pain. EXAM: CHEST - 2 VIEW COMPARISON:  December 07, 2015 FINDINGS: Lungs are clear. Heart size and pulmonary vascularity are normal. No adenopathy. No bone lesions. No pneumothorax. IMPRESSION: Lungs clear.  Cardiac silhouette normal. Electronically Signed   By: Bretta Bang III M.D.   On: 02/12/2020 09:20   CT VENOGRAM HEAD  Result Date: 02/12/2020 CLINICAL DATA:  Dural venous thrombosis suspected.  Headache and blurred vision. Left-sided numbness. EXAM: CT VENOGRAM HEAD TECHNIQUE: Head CT was performed followed by CT venography. CONTRAST:  14mL OMNIPAQUE IOHEXOL 300 MG/ML  SOLN COMPARISON:  Head CT 08/20/2010 FINDINGS: Brain has normal appearance without evidence of old or acute infarction, mass lesion, hemorrhage, hydrocephalus or extra-axial collection. No skull or skull base lesion. Some inflammatory change of the paranasal sinuses, particularly the right maxillary sinus where there is circumferential mucosal thickening and some layering fluid. This could be symptomatic. After contrast administration, no abnormal brain or leptomeningeal enhancement occurs. No evidence of venous thrombosis. Superior sagittal sinus is patent and normal. Both transverse sinuses and both sigmoid sinuses are normal. Both jugular veins show flow. Deep veins appear normal. No arterial pathology is identified. IMPRESSION: 1. Normal intracranial CT venography. No evidence of venous thrombosis. 2. Inflammatory disease of the paranasal sinuses, particularly the right maxillary sinus where there is circumferential mucosal thickening and some layering fluid. This could be symptomatic. 3. The brain itself appears normal. Electronically Signed   By: Paulina Fusi M.D.   On: 02/12/2020 09:42   US  PELVIC COMPLETE W TRANSVAGINAL AND TORSION R/O  Result Date: 02/12/2020 CLINICAL DATA:  Vaginal bleeding and pelvic pain EXAM: TRANSABDOMINAL AND TRANSVAGINAL ULTRASOUND OF PELVIS DOPPLER ULTRASOUND OF OVARIES TECHNIQUE: Both transabdominal and transvaginal ultrasound examinations of the pelvis were performed. Transabdominal technique was performed for global imaging of the pelvis including uterus, ovaries, adnexal regions, and pelvic cul-de-sac. It was necessary to proceed with endovaginal exam following the transabdominal exam to visualize the ovaries. Color and duplex Doppler ultrasound was utilized to evaluate blood flow to the ovaries.  COMPARISON:  None. FINDINGS: Uterus Measurements: 7.5 x 5.0 x 6.2 cm. = volume: 122 mL. No fibroids or other mass visualized. Endometrium Thickness: 20 mm.  Mildly heterogeneous. Right ovary Measurements: 4.2 x 2.4 x 3.2 cm. = volume: 17 mL. Normal appearance/no adnexal mass. Left ovary Measurements: 3.5 x 5.0 x 4.3 cm. = volume: 39 mL. 3.9 cm simple cyst is noted in the left ovary. Pulsed Doppler evaluation of both ovaries demonstrates normal low-resistance arterial and venous waveforms. Other findings No abnormal free fluid. IMPRESSION: Thickened heterogeneous endometrium with increased vascularity consistent with the patient's given clinical history of vaginal bleeding. Although this may be related to the current menstrual status the possibility of an underlying endometrial abnormality deserves consideration given the hypervascularity and heterogeneous appearance. Further evaluation is warranted. Electronically Signed   By: Alcide Clever M.D.   On: 02/12/2020 13:01      Subjective: Patient seen and examined at bedside, resting comfortably.  Aided with video interpreter, [place] ID [id number].  Patient doing well, no complaints.  Weakness, fatigue has resolved.  Vaginal bleeding has also remarkably improved only going through 4 pads yesterday.  Hemoglobin continues to rise, up to 9.4 this morning.  Denies headache, no dizziness, no visual changes, no chest pain, palpitations, no shortness of breath, no abdominal pain, no weakness, no fatigue, no paresthesias.  No acute events overnight per nursing staff.  Discharge Exam: Vitals:   02/13/20 2344 02/14/20 0614  BP: 122/78 108/64  Pulse: 75 72  Resp: 18 18  Temp: 98 F (36.7 C) 98 F (36.7 C)  SpO2: 99% 96%   Vitals:   02/13/20 1212 02/13/20 1643 02/13/20 2344 02/14/20 0614  BP: 118/69 104/82 122/78 108/64  Pulse: 72 75 75 72  Resp: Temp: 98.5 F (36.9 C) 98 F (36.7 C) 98 F (36.7 C) 98 F (36.7 C)  TempSrc: Oral Oral Oral  Oral  SpO2: 97% 99% 99% 96%  Weight:    80.7 kg  Height:        General: Pt is alert, awake, not in acute distress Cardiovascular: RRR, S1/S2 +, no rubs, no gallops Respiratory: CTA bilaterally, no wheezing, no rhonchi, oxygenating well on room air Abdominal: Soft, NT, ND, bowel sounds + Extremities: no edema, no cyanosis    The results of significant diagnostics from this hospitalization (including imaging, microbiology, ancillary and laboratory) are listed below for reference.     Microbiology: Recent Results (from the past 240 hour(s))  SARS Coronavirus 2 by RT PCR (hospital order, performed in [facility] hospital lab) Nasopharyngeal Nasopharyngeal Swab     Status: None   Collection Time: 02/12/20  4:33 PM   Specimen: Nasopharyngeal Swab  Result Value Ref Range Status   SARS Coronavirus 2 NEGATIVE NEGATIVE Final    Comment: (NOTE) SARS-CoV-2 target nucleic acids are NOT DETECTED.  The SARS-CoV-2 RNA is generally detectable in upper and lower respiratory specimens during the acute phase of infection. The  lowest concentration of SARS-CoV-2 viral copies this assay can detect is 250 copies / mL. A negative result does not preclude SARS-CoV-2 infection and should not be used as the sole basis for treatment or other patient management decisions.  A negative result may occur with improper specimen collection / handling, submission of specimen other than nasopharyngeal swab, presence of viral mutation(s) within the areas targeted by this assay, and inadequate number of viral copies (<250 copies / mL). A negative result must be combined with clinical observations, patient history, and epidemiological information.  Fact Sheet for Patients:   BoilerBrush.com.cyhttps://www.fda.gov/media/136312/download  Fact Sheet for Healthcare Providers: https://pope.com/https://www.fda.gov/media/136313/download  This test is not yet approved or  cleared by the [place] FDA and has been authorized for detection and/or  diagnosis of SARS-CoV-2 by FDA under an Emergency Use Authorization (EUA).  This EUA will remain in effect (meaning this test can be used) for the duration of the COVID-19 declaration under Section 564(b)(1) of the Act, 21 U.S.C. section 360bbb-3(b)(1), unless the authorization is terminated or revoked sooner.  Performed at [facility], [address] [place], [place] [address]   Urine culture     Status: Abnormal   Collection Time: 02/13/20  2:06 AM   Specimen: Urine, Random  Result Value Ref Range Status   Specimen Description URINE, RANDOM  Final   Special Requests   Final    NONE Performed at [facility], [address] [place], [place] [address]    Culture MULTIPLE SPECIES PRESENT, SUGGEST RECOLLECTION (A)  Final   Report Status 02/13/2020 FINAL  Final     Labs: BNP (last 3 results) No results for input(s): BNP in the last 8760 hours. Basic Metabolic Panel: Recent Labs  Lab 02/10/20 1837 02/11/20 1601 02/12/20 1606 02/13/20 0939 02/14/20 0539  NA 136 137  --  139 134*  K 3.7 3.8  --  3.3* 3.5  CL 103 104  --  107 106  CO2 23 24  --  22 22  GLUCOSE 89 107*  --  110* 91  BUN 8 11  --  11 10  CREATININE 0.70 0.77  --  0.73 0.75  CALCIUM 8.6* 8.9  --  8.6* 8.4*  MG  --   --  1.7  --   --   PHOS  --   --  3.4  --   --    Liver Function Tests: Recent Labs  Lab 02/11/20 1601 02/13/20 0939  AST 19 18  ALT 17 15  ALKPHOS 74 64  BILITOT 0.2* 0.5  PROT 6.5 5.5*  ALBUMIN 3.4* 2.9*   No results for input(s): LIPASE, AMYLASE in the last 168 hours. No results for input(s): AMMONIA in the last 168 hours. CBC: Recent Labs  Lab 02/11/20 1601 02/11/20 1601 02/12/20 0958 02/12/20 2133 02/13/20 0939 02/13/20 1934 02/14/20 0539  WBC 7.5  --  6.5 8.7 8.9  --  11.2*  HGB 8.5*   < > 6.9* 8.6* 8.4* 9.1* 9.2*  HCT 28.9*   < > 23.4* 27.4* 26.4* 28.8* 28.7*  MCV 83.8  --  83.6 82.0 81.2  --  82.5  PLT 467*  --  386 395 413*  --  422*   < > = values in  this interval not displayed.   Cardiac Enzymes: No results for input(s): CKTOTAL, CKMB, CKMBINDEX, TROPONINI in the last 168 hours. BNP: Invalid input(s): POCBNP CBG: No results for input(s): GLUCAP in the last 168 hours. D-Dimer  No results for input(s): DDIMER in the last 72 hours. Hgb A1c No results for input(s): HGBA1C in the last 72 hours. Lipid Profile No results for input(s): CHOL, HDL, LDLCALC, TRIG, CHOLHDL, LDLDIRECT in the last 72 hours. Thyroid function studies Recent Labs    02/12/20 0958  TSH 2.194   Anemia work up Recent Labs    02/12/20 1606  VITAMINB12 197  FOLATE 17.9  FERRITIN 6*  TIBC 490*  IRON 27*   Urinalysis    Component Value Date/Time   COLORURINE RED (A) 02/13/2020 0206   APPEARANCEUR CLOUDY (A) 02/13/2020 0206   LABSPEC 1.025 02/13/2020 0206   PHURINE 7.0 02/13/2020 0206   GLUCOSEU NEGATIVE 02/13/2020 0206   HGBUR LARGE (A) 02/13/2020 0206   BILIRUBINUR NEGATIVE 02/13/2020 0206   KETONESUR NEGATIVE 02/13/2020 0206   PROTEINUR NEGATIVE 02/13/2020 0206   UROBILINOGEN 0.2 10/23/2012 2314   NITRITE NEGATIVE 02/13/2020 0206   LEUKOCYTESUR NEGATIVE 02/13/2020 0206   Sepsis Labs Invalid input(s): PROCALCITONIN,  WBC,  LACTICIDVEN Microbiology Recent Results (from the past 240 hour(s))  SARS Coronavirus 2 by RT PCR (hospital order, performed in [facility] hospital lab) Nasopharyngeal Nasopharyngeal Swab     Status: None   Collection Time: 02/12/20  4:33 PM   Specimen: Nasopharyngeal Swab  Result Value Ref Range Status   SARS Coronavirus 2 NEGATIVE NEGATIVE Final    Comment: (NOTE) SARS-CoV-2 target nucleic acids are NOT DETECTED.  The SARS-CoV-2 RNA is generally detectable in upper and lower respiratory specimens during the acute phase of infection. The lowest concentration of SARS-CoV-2 viral copies this assay can detect is 250 copies / mL. A negative result does not preclude SARS-CoV-2 infection and should not be used as the sole  basis for treatment or other patient management decisions.  A negative result may occur with improper specimen collection / handling, submission of specimen other than nasopharyngeal swab, presence of viral mutation(s) within the areas targeted by this assay, and inadequate number of viral copies (<250 copies / mL). A negative result must be combined with clinical observations, patient history, and epidemiological information.  Fact Sheet for Patients:   BoilerBrush.com.cy  Fact Sheet for Healthcare Providers: https://pope.com/  This test is not yet approved or  cleared by the [place] FDA and has been authorized for detection and/or diagnosis of SARS-CoV-2 by FDA under an Emergency Use Authorization (EUA).  This EUA will remain in effect (meaning this test can be used) for the duration of the COVID-19 declaration under Section 564(b)(1) of the Act, 21 U.S.C. section 360bbb-3(b)(1), unless the authorization is terminated or revoked sooner.  Performed at [facility], [address] [place], [place] [address]   Urine culture     Status: Abnormal   Collection Time: 02/13/20  2:06 AM   Specimen: Urine, Random  Result Value Ref Range Status   Specimen Description URINE, RANDOM  Final   Special Requests   Final    NONE Performed at [facility], [address], [place], [place] [address]    Culture MULTIPLE SPECIES PRESENT, SUGGEST RECOLLECTION (A)  Final   Report Status 02/13/2020 FINAL  Final     Time coordinating discharge: Over 30 minutes  SIGNED:   Alvira Philips [place], DO  [facility] 02/14/2020, 10:15 AM

## 2020-02-14 NOTE — Discharge Instructions (Signed)
Sangrado uterino anormal Abnormal Uterine Bleeding El sangrado uterino anormal sucede cuando el sangrado del tero es ms duradero o ms abundante que lo normal. Esto puede incluir lo siguiente:  Hemorragias entre los perodos [place].  Sangrado luego de [employer]  Sangrado ms abundante que lo normal.  Perodos que duran ms de lo normal.  Sangrado durante la menopausia. Esta afeccin puede ser causada por muchos problemas. Si tiene un sangrado que no considera normal, debe consultar al [employer]. El tratamiento depende de la causa del sangrado. Siga estas indicaciones en su casa:  Controle su afeccin para detectar cualquier cambio.  No use tampones, no se haga duchas vaginales ni mantenga relaciones sexuales si su mdico le dice que no lo haga.  Cambie los apsitos con frecuencia.  Realcese los exmenes de rutina para mujeres. Asegrese de hacerse un examen plvico y de control de cncer de cuello uterino.  Concurra a todas las visitas de control como se lo haya indicado el mdico. Esto es importante. Comunquese con un mdico si:  El sangrado dura ms de una semana.  Se siente mareada por momentos.  Siente que va a vomitar (nuseas).  Vomita. Solicite ayuda de inmediato si:  Se desmaya.  Debe cambiarse los apsitos cada hora.  Siente dolor de estmago (abdominal).  Tiene fiebre.  Vanessa Richmond.  Se debilita.  Elimina cogulos de sangre grandes por la vagina. Resumen  El sangrado uterino anormal sucede cuando el sangrado del tero es ms duradero o ms abundante que lo normal.  Esta afeccin puede ser causada por muchos problemas. Si tiene un sangrado que no considera normal, debe consultar al [employer].  El tratamiento depende de la causa del sangrado. Esta informacin no tiene Theme park manager el consejo del mdico. Asegrese de hacerle al mdico cualquier pregunta que tenga. Document Revised: 03/21/2017 Document Reviewed:  11/08/2016 Elsevier Patient Education  2020 Elsevier Inc.  Anemia por deficiencia de hierro en adultos Iron Deficiency Anemia, Adult La anemia por deficiencia de hierro es Insurance claims handler en la que la concentracin de glbulos rojos o hemoglobina en la sangre est por debajo de lo normal debido a la falta de hierro. La hemoglobina es la sustancia de los glbulos rojos que lleva el oxgeno a todos los tejidos del cuerpo. Cuando la concentracin de los glbulos rojos o de la hemoglobina es demasiado baja, no llega suficiente oxgeno a estos tejidos. La anemia por deficiencia de hierro, generalmente, es de larga duracin (crnica) y se desarrolla con el tiempo. Puede causar sntomas o no. ste es un tipo comn de anemia. Cules son las causas? Esta afeccin puede ser causada por lo siguiente:  No hay suficiente hierro en la dieta.  Prdida de sangre a causa de una hemorragia en el intestino.  Prdida de sangre por una afeccin gastrointestinal, como la enfermedad de Crohn.  Extraccin de Retail buyer con frecuencia, como en donaciones de [place].  Absorcin intestinal anormal.  Perodos menstruales muy abundantes en las mujeres.  Cncer en el aparato digestivo, como cncer de colon. Cules son los signos o los sntomas? Los sntomas de esta afeccin pueden incluir los siguientes:  [place].  Dolor de [place].  Piel, labios y uas plidos.  Prdida del apetito.  Debilidad.  Falta de aire.  Mareos.  Manos y pies fros.  Latidos cardacos rpidos o irregulares.  Irritabilidad. Es ms frecuente en los casos de anemia grave.  Respiracin rpida. Es ms frecuente en los casos de anemia grave. Los casos leves de anemia podran no causar  sntomas. Cmo se diagnostica? Esta afeccin se diagnostica en funcin de lo siguiente:  Sus antecedentes mdicos.  Un examen fsico.  Anlisis de sangre. Es posible que le realicen otros estudios para Clinical research associate la causa subyacente de la anemia;  por ejemplo:  Anlisis para Engineer, manufacturing la presencia de [employer] heces (anlisis de sangre oculta en heces).  Un procedimiento para examinar el interior del colon y el recto (colonoscopia).  Un procedimiento para examinar el interior del esfago y Investment banker, corporate (endoscopia).  Un estudio en el que se toman clulas de la mdula sea (aspiracin de mdula sea) o se extrae lquido de la mdula sea para examinarlos (biopsia). Esto debe realizarse en contadas ocasiones. Cmo se trata? Esta afeccin se trata remediando la causa de la deficiencia de hierro. El tratamiento puede incluir lo siguiente:  Agregarle a la dieta alimentos ricos en hierro.  Tomar suplementos de hierro. Si est embarazada o amamantando, es posible que deba tomar una dosis adicional de hierro, ya que su dieta normal generalmente no proporciona la cantidad necesaria.  Aumentar la ingesta de vitaminaC. La vitamina C ayuda al organismo a Set designer. Es posible que el mdico le recomiende tomar los suplementos de hierro con un vaso de jugo de naranja o con un suplemento de vitaminaC.  Medicamentos para disminuir el flujo menstrual abundante.  [place]. Es posible que deba realizarse varios anlisis de sangre para determinar si el tratamiento da resultado. Segn cul sea la causa preexistente, la anemia debera corregirse en un lapso de desde el comienzo del [place]. Si el tratamiento no funciona, es posible que deba realizarse otros estudios. Siga estas indicaciones en su casa: Medicamentos  [employer] de venta libre y los recetados solamente como se lo haya indicado el mdico. Esto incluye vitaminas y suplementos de hierro.  Si no tolera tomar los suplementos de hierro por boca, hable con su mdico sobre la posibilidad de administrarlos por una vena (va intravenosa) o mediante una inyeccin en un msculo.  Para lograr una mejor absorcin del hierro, los suplementos de hierro se deben tomar  con el estmago vaco. Si no los tolera con el estmago vaco, posiblemente deba tomarlos con la comida.  No beba leche ni tome anticidos al [employer] suplementos de hierro. La leche y los anticidos podran interferir en la absorcin del hierro.  Los suplementos de hierro pueden Investment banker, corporate. Para evitar el estreimiento, incluya fibra en su dieta como se lo haya indicado el mdico. Posiblemente tambin le recomienden tomar un laxante. Comida y bebida   Hable con el mdico antes de cambiar la dieta. Podra recomendarle que consuma alimentos que contienen mucho hierro; por ejemplo: ? Hgado. ? Carne de res con bajo contenido de grasa [employer]). ? Panes y cereales con hierro agregado (fortificados). ? Huevos. ? Frutas pasas. ? Verduras de [employer] oscuro.  Para ayudar al organismo a aprovechar el hierro de los alimentos con alto contenido de hierro, Buyer, retail dichos alimentos al mismo tiempo que frutas y verduras frescas que tengan mucha vitaminaC. Algunos alimentos ricos en vitaminaC son los siguientes: ? Naranjas. ? Pimientos. ? Tomates. ? Mangos.  Beba suficiente lquido como para mantener la orina clara o de color amarillo plido. Instrucciones generales  Retome sus actividades normales como se lo haya indicado el mdico. Pregntele al mdico qu actividades son seguras para usted.  Mantenga una buena higiene. La anemia puede hacerlo ms propenso a contraer enfermedades e infecciones.  Concurra a todas las  visitas de control como se lo haya indicado el mdico. Esto es importante. Comunquese con un mdico si:  Siente nuseas o vomita.  Se siente dbil.  Comienza a sudar sin motivo.  Presenta sntomas de estreimiento; por ejemplo: ? Defecar menos de tres veces por semana. ? Esforzarse mucho para defecar. ? Tener las heces secas y duras, o ms grandes que lo normal. ? Sensacin de estar lleno o hinchado. ? Dolor en la parte baja del  abdomen. ? No sentir alivio despus de defecar. Solicite ayuda de inmediato si:  Se desmaya. Si esto sucede, no conduzca por sus propios medios [employer]. Comunquese con el servicio de emergencias de su localidad (911 en los [place]).  Siente dolor en el pecho.  Le falta el aire con las siguientes caractersticas: ? Es intensa. ? Empeora con la actividad fsica.  Tiene latidos cardacos acelerados.  Se siente mareado cuando se para despus de estar sentado o acostado. Esta informacin no tiene Theme park manager el consejo del mdico. Asegrese de hacerle al mdico cualquier pregunta que tenga. Document Revised: 02/16/2017 Document Reviewed: 10/18/2015 Elsevier Patient Education  2020 [employer].

## 2020-02-14 NOTE — Progress Notes (Signed)
Pt alert and oriented x4. Pt ambulatory at discharge. Pt had no complaints. Pt verbalized understanding of discharge instructions. Vitals WDL upon departure. [facility] pharmacy brought meds.

## 2020-02-25 ENCOUNTER — Ambulatory Visit (INDEPENDENT_AMBULATORY_CARE_PROVIDER_SITE_OTHER): Payer: Medicaid Other | Admitting: Primary Care

## 2020-02-25 ENCOUNTER — Other Ambulatory Visit: Payer: Self-pay

## 2020-02-25 ENCOUNTER — Encounter (INDEPENDENT_AMBULATORY_CARE_PROVIDER_SITE_OTHER): Payer: Self-pay | Admitting: Primary Care

## 2020-02-25 VITALS — BP 137/86 | HR 73 | Temp 97.3°F | Ht 64.0 in | Wt 181.4 lb

## 2020-02-25 DIAGNOSIS — N939 Abnormal uterine and vaginal bleeding, unspecified: Secondary | ICD-10-CM

## 2020-02-25 DIAGNOSIS — D62 Acute posthemorrhagic anemia: Secondary | ICD-10-CM

## 2020-02-25 DIAGNOSIS — Z09 Encounter for follow-up examination after completed treatment for conditions other than malignant neoplasm: Secondary | ICD-10-CM

## 2020-02-25 DIAGNOSIS — R059 Cough, unspecified: Secondary | ICD-10-CM

## 2020-02-25 DIAGNOSIS — Z23 Encounter for immunization: Secondary | ICD-10-CM

## 2020-02-25 DIAGNOSIS — Z0000 Encounter for general adult medical examination without abnormal findings: Secondary | ICD-10-CM

## 2020-02-25 DIAGNOSIS — I10 Essential (primary) hypertension: Secondary | ICD-10-CM

## 2020-02-25 DIAGNOSIS — Z7689 Persons encountering health services in other specified circumstances: Secondary | ICD-10-CM

## 2020-02-25 DIAGNOSIS — R05 Cough: Secondary | ICD-10-CM

## 2020-02-25 MED ORDER — SENNA 8.6 MG PO TABS: 1.0000 | ORAL_TABLET | Freq: Every day | ORAL | 1 refills | Status: AC | PRN

## 2020-02-25 NOTE — Progress Notes (Signed)
[facility]  Assessment and Plan: [name] was seen today for hospitalization follow-up , establish care and concerned about a cough.  Diagnoses and all orders for this visit:  Encounter to establish care [name], NP-C will be your  (PCP) she is mastered prepared . Able to diagnosed and treatment also  answer health concern as well as continuing care of varied medical conditions, not limited by cause, organ system, or diagnosis.   Abnormal uterine bleeding -     CBC with Differential  Acute blood loss anemia -     CBC with Differential  Need for Tdap vaccination Tdap is recommended every 10 years for adults  -     Tdap vaccine greater than or equal to 7yo IM  Cough On AVS instruction for over the counter treatment   Hospital discharge follow-up retrieved from hospital discharge 02/14/20 Recommendations for Outpatient Follow-up:  1. Follow up with PCP, [name] scheduled on 02/25/2020 at 08 30- completed  2. Follow-up with GYN, Dr. Vergie Living on 03/09/2020 at 09 30 3. Please obtain CBC in one week completed  4. Started on Megace 40 mg p.o. twice daily for menorrhagia 5. Started on ferrous sulfate 325 mg p.o. twice daily for iron deficiency anemia related to menorrhagia  HPI Vanessa Richmond is  A 49 y.o.female presents for follow up from the hospital. Admit date to the hospital was 02/11/20, patient was discharged from the hospital on 02/14/20, patient was admitted for: Symptomatic anemia secondary to abnormal uterine bleeding/menorrhagia. She has pain in back of neck and bilateral shoulder. (hispanic-Spanish speaking interpretor use Rebeca [id number]  Past Medical History:  Diagnosis Date  . GERD (gastroesophageal reflux disease)   . Iron deficiency anemia      No Known Allergies    Current Outpatient Medications on File Prior to Visit  Medication Sig Dispense Refill  . ferrous sulfate 325 (65 FE) MG tablet Take 1 tablet (325 mg total) by mouth 2  (two) times daily with a meal. 60 tablet 0  . megestrol (MEGACE) 40 MG tablet Take 1 tablet (40 mg total) by mouth 2 (two) times daily. 60 tablet 0   No current facility-administered medications on file prior to visit.    ROS: all negative except above.   Physical Exam:  General Appearance: Well nourished, obese female in no apparent distress. Eyes: PERRLA, EOMs, conjunctiva no swelling or erythema Sinuses: No Frontal/maxillary tenderness ENT/Mouth: Ext aud canals clear, TMs without erythema, bulging.  Hearing normal.  Neck: Supple, thyroid normal.  Respiratory: Respiratory effort normal, BS equal bilaterally without rales, rhonchi, wheezing or stridor.  Cardio: RRR with no MRGs. Brisk peripheral pulses without edema.  Abdomen: Soft, + BS.  Non tender, no guarding, rebound, hernias, masses. Lymphatics: Non tender without lymphadenopathy.  Musculoskeletal: Full ROM, 5/5 strength, normal gait.  Skin: Warm, dry without rashes, lesions, ecchymosis.  Neuro: Cranial nerves intact. Normal muscle tone, no cerebellar symptoms. Sensation intact.  Psych: Awake and oriented X 3, normal affect, Insight and Judgment appropriate.     Grayce Sessions, NP 10:03 AM

## 2020-02-25 NOTE — Patient Instructions (Addendum)
https://www.cdc.gov/vaccines/hcp/vis/vis-statements/tdap.pdf">  Vanessa Richmond Tdap (ttanos, difteria y [place]): lo que debe saber Tdap (Tetanus, Diphtheria, Pertussis) Vaccine: What You Need to Know 1. Por qu vacunarse? La vacuna Tdap puede prevenir el ttanos, la difteria y la tos Vanessa Richmond. La difteria y la tos Vanessa Richmond se Vanessa [place]. El ttanos ingresa al organismo a travs de cortes o heridas.  El Vanessa Richmond (T) provoca rigidez dolorosa en los msculos. El ttanos puede causar graves problemas de Vanessa Richmond, como no poder abrir la boca, Warehouse manager dificultad para tragar y Industrial/product designer, o la muerte.  La DIFTERIA (D) puede causar dificultad para respirar, insuficiencia cardaca, parlisis o muerte.  La TOS FERINA (aP) tambin conocida como "tos convulsa" puede causar tos violenta e incontrolable lo que hace difcil respirar, comer o beber. La tos ferina puede ser muy grave en los bebs y en los nios pequeos, y causar neumona, convulsiones, dao cerebral o la [name]. En adolescentes y [name], puede causar prdida de [name], prdida del control de la vejiga, [place] y fracturas de Forensic psychologist al toser de Wellsite geologist intensa. 2. [name] Tdap La vacuna Tdap es solo para nios de 7 aos en adelante, adolescentes y [name].  Los adolescentes debe recibir una dosis nica de la vacuna Tdap, preferentemente a los 11 o 12 aos. Las mujeres embarazadas deben recibir una dosis de la vacuna Tdap en cada embarazo para proteger al recin nacido de la tos [name]. Los bebs tienen mayor riesgo de sufrir complicaciones graves y potencialmente mortales debido a la tos [name]. Los adultos que nunca recibieron la vacuna Tdap deben recibir una dosis. Adems, los adultos deben recibir una dosis de refuerzo cada 10 aos, o antes si la persona sufre una [name] o una herida grave y [name]. Las dosis de refuerzo pueden ser de la vacuna Tdap o la Td (una vacuna diferente que protege contra el ttanos y la difteria, pero no contra  la tos [name]). La vacuna Tdap puede ser administrada al mismo tiempo que otras vacunas. 3. Hable con el mdico Comunquese con la persona que le coloca las vacunas si la persona que la recibe:  Ha tenido una reaccin alrgica despus de Vanessa Richmond dosis anterior de cualquier vacuna contra el ttanos, la difteria o la tos [name], o cualquier alergia grave, potencialmente mortal.  Ha tenido un coma, disminucin del nivel de la conciencia o convulsiones prolongadas dentro de los 7 [address] posteriores a una dosis anterior de cualquier vacuna contra la tos ferina (DTP, DTaP o Tdap).  Tiene convulsiones u otro problema del sistema nervioso.  Alguna vez tuvo sndrome de Guillain-Barr (tambin llamado SGB).  Ha tenido dolor intenso o hinchazn despus de una dosis anterior de cualquier vacuna contra el ttanos o la difteria. En algunos casos, es posible que el mdico decida posponer la aplicacin de la vacuna Tdap para una visita en el futuro.  Las personas que sufren trastornos menores, como un resfro, pueden vacunarse. Las personas que tienen enfermedades moderadas o graves generalmente deben esperar hasta recuperarse para poder recibir la vacuna Tdap.  Su mdico puede darle ms informacin. 4. Riesgos de Vanessa Richmond reaccin a la vacuna  Despus de recibir la vacuna Tdap a veces se puede Surveyor, mining, enrojecimiento o [name] donde se aplic la inyeccin, fiebre leve, dolor de cabeza, sensacin de cansancio y nuseas, vmitos, diarrea o dolor de Vanessa Richmond. Las personas a veces se desmayan despus de procedimientos mdicos, incluida la vacunacin. Informe al mdico si se siente mareado, tiene cambios en la visin o zumbidos  en los odos.  Al igual que con cualquier Automatic Data, existe una probabilidad muy remota de que una vacuna cause una reaccin alrgica grave, otra lesin grave o la muerte. 5. Qu pasa si se presenta un problema grave? Podra producirse una reaccin alrgica despus de que la  persona vacunada abandone la clnica. Si observa signos de Runner, broadcasting/film/video grave (ronchas, hinchazn de la cara y la garganta, dificultad para respirar, latidos cardacos acelerados, mareos o debilidad), llame al 9-1-1 y lleve a la persona al hospital ms cercano. Si se presentan otros signos que le preocupan, comunquese con su mdico.  Las reacciones adversas deben informarse al Sistema de Informe de Eventos Adversos de Administrator, arts (Vaccine Adverse Event Reporting System, VAERS). Por lo general, el mdico presenta este informe o puede hacerlo usted mismo. Visite el sitio web del VAERS en www.vaers.LAgents.no o llame al [phone]. El VAERS es solo para Biomedical engineer; su personal no proporciona asesoramiento mdico. 6. Programa Nacional de Compensacin de Daos por Vanessa Richmond El SunTrust de Compensacin de Daos por Administrator, [name], [name] es un programa federal que fue creado para Patent examiner a las personas que puedan haber sufrido daos al recibir ciertas vacunas. Visite el sitio web del VICP en SpiritualWord.at o llame al 1-430-491-1890 para obtener ms informacin acerca del programa y de cmo presentar un reclamo. Hay un lmite de tiempo para presentar un reclamo de compensacin. 7. Cmo puedo obtener ms informacin?  Pregntele a su mdico.  Comunquese con el servicio de salud de su localidad o su estado.  Comunquese con Building control surveyor for [employer] and Prevention, CDC (Centros para el Control y la Prevencin de [place]): ? Llame al [phone] (1-800-CDC-INFO) o ? Visite el sitio Environmental manager en PicCapture.uy Declaracin de informacin de la vacuna Tdap (ttanos, difteria y [place]) (06/02/2018) Esta informacin no tiene Theme park manager el consejo del mdico. Asegrese de hacerle al mdico cualquier pregunta que tenga. Document Revised: 09/25/2018 Document Reviewed: 09/25/2018 Elsevier Patient  Education  2020 Vanessa Richmond. Gripe en los adultos Influenza, Adult A la gripe tambin se la conoce como "influenza". Es una [employer] , la nariz y la garganta (vas respiratorias). La causa un virus. La gripe provoca sntomas que son similares a los de un resfro. Tambin causa fiebre alta y dolores corporales. Se transmite fcilmente de persona a persona (es contagiosa). La mejor manera de prevenir la gripe es aplicndose la vacuna contra la gripe todos los aos. Cules son las causas? La causa de esta afeccin es el virus de la influenza. Puede contraer el virus de las siguientes maneras:  Respirar las gotitas que estn en el aire y que provienen de la tos o el estornudo de una persona que tiene el virus.  Tocar algo que tiene el virus (est contaminado) y luego tocarse la boca, la nariz o los ojos. Qu incrementa el riesgo? Hay ciertas cosas que lo pueden hacer ms propenso a Warden/ranger. Estas incluyen lo siguiente:  No lavarse las manos con frecuencia.  Tener contacto cercano con [employer] durante la temporada de resfro y gripe.  Tocarse la boca, los ojos o la nariz sin antes lavarse las manos.  No recibir la Teachers Insurance and An[employer]ity Association. Puede correr un mayor riesgo de tener gripe, junto con problemas graves como una infeccin pulmonar (neumona), si:  Es mayor de 65 aos de edad.  Est embarazada.  Tiene debilitado el sistema que combate las defensas (sistema inmunitario) debido a  una enfermedad o porque toma determinados medicamentos.  Tiene una enfermedad prolongada (crnica), por ejemplo: ? Enfermedad cardaca, renal o pulmonar. ? Diabetes. ? Asma.  Tiene un trastorno heptico.  Tiene mucho sobrepeso (obesidad [place]).  Tiene anemia. Esta es una afeccin que afecta a los glbulos rojos. Cules son los signos o los sntomas? Los sntomas normalmente comienzan de repente y Armando Reichert 2 y [address] Pueden incluir los siguientes:  Vanessa Richmond  y escalofros.  Dolores de [place], dolores en el cuerpo o dolores musculares.  Dolor de Advertising copywriter.  Tos.  Secrecin o congestin nasal.  Dentist.  No desear comer en las cantidades normales (prdida del apetito).  Debilidad o cansancio (fatiga).  Mareos.  Malestar estomacal (nuseas) o ganas de devolver (vmitos). Cmo se trata? Si la gripe se encuentra de forma temprana, se la puede tratar con medicamentos que pueden ayudar a reducir la gravedad de la enfermedad y reducir su duracin (medicamentos antivirales). Estos pueden administrarse por boca (va oral) o por va (catter) intravenosa. Cuidarse en su hogar puede ayudar a que mejoren los sntomas. El mdico puede sugerirle lo siguiente:  Tomar medicamentos de Sales promotion account executive.  Beber mucho lquido. La gripe suele desaparecer sola. Si tiene sntomas muy graves u otros problemas, puede recibir tratamiento en un hospital. Siga estas indicaciones en su casa:     Actividad  Descanse todo lo que sea necesario. Duerma lo suficiente.  Vanessa Richmond en su casa y no concurra al Aleen Campi o a la escuela, como se lo haya indicado el mdico. ? No salga de su casa hasta que no haya tenido fiebre por 24horas sin tomar medicamentos. ? Salga de su casa solo para ir al Vanessa Richmond. Comida y bebida  Vanessa Richmond SRO (solucin de rehidratacin oral). Es Vanessa Richmond bebida que se vende en farmacias y tiendas.  Beba suficiente lquido para Radio producer pis (la orina) de color amarillo plido.  En la medida en que pueda, beba lquidos claros en pequeas cantidades. Los lquidos transparentes son, por ejemplo: ? Westley Hummer. ? Trocitos de hielo. ? Jugo de frutas con agua agregada (jugo de frutas diluido). ? Bebidas deportivas de bajas caloras.  En la medida en que pueda, consuma alimentos blandos y fciles de digerir en pequeas cantidades. Estos alimentos incluyen: ? Bananas. ? Pur de Praxair. ? Arroz. ? BJ's. ? Tostadas. ? Galletas.  No  coma ni beba lo siguiente: ? Lquidos con alto contenido de azcar o cafena. ? Alcohol. ? Alimentos condimentados o con alto contenido de [place] Indicaciones generales  Baxter International de venta libre y los recetados solamente como se lo haya indicado el mdico.  Use un humidificador de aire fro para que el aire de su casa est ms hmedo. Esto puede facilitar la respiracin.  Al toser o estornudar, cbrase la boca y la [place].  Lvese las manos con agua y jabn frecuentemente, en especial despus de toser o Engineering geologist. Use desinfectante para manos con alcohol si no dispone de [place] y [place].  Concurra a todas las visitas de control como se lo haya indicado el mdico. Esto es importante. Cmo se evita?   Colquese la vacuna antigripal todos los [place]. Puede colocarse la vacuna contra la gripe a fines de verano, en otoo o en invierno. Pregntele al mdico cundo debe aplicarse la vacuna contra la gripe.  Evite el contacto con personas que estn enfermas durante el otoo y el invierno (la temporada de resfro y gripe). Comunquese con un mdico si:  Tiene sntomas  nuevos.  Tiene los siguientes sntomas: ? Vanessa Richmond, newspaper. ? Materia fecal lquida (diarrea). ? Fiebre.  La tos empeora.  Empieza a tener ms mucosidad.  Tiene Programme researcher, broadcasting/film/video.  [place]. Solicite ayuda inmediatamente si:  Le falta el aire.  Tiene dificultad para respirar.  La piel o las uas se ponen de un color azulado.  Presenta dolor muy intenso o rigidez en el cuello.  Tiene dolor de cabeza repentino.  Le duele la cara o el odo de forma repentina.  No puede comer ni beber sin vomitar. Resumen  La gripe es una infeccin en los pulmones, la nariz y Administrator. La causa un virus.  Tome los medicamentos de venta libre y los recetados solamente como se lo haya indicado el mdico.  Aplicarse la vacuna contra la gripe todos los aos es la mejor manera de evitar contagiarse la gripe. Esta  informacin no tiene Theme park manager el consejo del mdico. Asegrese de hacerle al mdico cualquier pregunta que tenga. Document Revised: 12/27/2017 Document Reviewed: 12/27/2017 Elsevier Patient Education  2020 Vanessa Richmond.  Tos en los adultos Cough, Adult La tos ayuda a despejar la garganta y los pulmones. La tos puede ser un signo de una enfermedad u otra afeccin mdica. Una tos aguda puede durar [employer] 2 o 3 semanas, mientras que una tos crnica puede durar 8semanas o ms tiempo. Hay muchas cosas que pueden causar tos. Estas incluyen lo siguiente:  Grmenes (virus o bacterias) que atacan las vas respiratorias.  Inhalacin de cosas que alteran (irritan) los pulmones.  Alergias.  Asma.  Mucosidad que se desliza por la parte posterior de la garganta (goteo posnasal).  Fumar.  cido que vuelve desde el estmago hacia el tubo que transporta los alimentos desde la boca hasta el estmago (reflujo gastroesofgico).  Algunos medicamentos.  Problemas pulmonares.  Otras enfermedades, como insuficiencia cardaca o un cogulo de sangre en el pulmn (embolia pulmonar). Siga estas instrucciones en su casa: Medicamentos  Tome los medicamentos de venta libre y los recetados solamente como se lo haya indicado el mdico.  Hable con el mdico antes de tomar medicamentos que detienen la tos (antitusivos). Estilo de vida   No fume y trate de no estar cerca de humo. No consuma ningn producto que contenga nicotina o tabaco, como cigarrillos, cigarrillos electrnicos y tabaco de Theatre manager. Si necesita ayuda para dejar de fumar, consulte al mdico.  Beba suficiente lquido para mantener el pis (la [place]) de color amarillo plido.  Evite la cafena.  No beba alcohol si el mdico se lo prohbe. Instrucciones generales   Fjese si hay algn cambio en la tos. Informe a su mdico sobre ellos.  Siempre cbrase la boca al toser.  Aljese de las cosas que lo hagan toser, como perfumes,  velas, humo de fogatas o productos de limpieza.  Si el aire es Safeway Inc, use un humidificador o un vaporizador de niebla fra en su hogar.  Si la tos empeora por la noche, pruebe con usar almohadas adicionales para elevar la cabeza mientras duerme.  Descanse todo lo que sea necesario.  Concurra a todas las visitas de [address] se lo haya indicado el mdico. Esto es importante. Comunquese con un mdico si:  Aparecen nuevos sntomas.  Tose y escupe pus.  La tos no mejora despus de 2 o 3semanas o empeora.  Los medicamentos para la tos no Lowe's Companies tos y usted no duerme bien.  Siente un dolor que empeora o un dolor que no  se alivia con medicamentos.  Tiene fiebre.  Est adelgazando y no sabe por qu.  Transpira durante la noche. Solicite ayuda inmediatamente si:  Tose y [employer].  Tiene dificultad para respirar.  El Hershey Company late [place] rpido. Estos sntomas pueden Customer service manager. No espere a ver si los sntomas desaparecen. Solicite atencin mdica de inmediato. Comunquese con el servicio de emergencias de su localidad (911 en los [place]). No conduzca por sus propios medios OfficeMax [employer]orporated. Resumen  La tos ayuda a despejar la garganta y los pulmones. Hay muchas cosas que pueden causar tos.  Tome los medicamentos de venta libre y los recetados solamente como se lo haya indicado el mdico.  Siempre cbrase la boca al toser.  Comunquese con un mdico si tiene sntomas nuevos o tiene una tos que no mejora o que [place]. Esta informacin no tiene Theme park manager el consejo del mdico. Asegrese de hacerle al mdico cualquier pregunta que tenga. Document Revised: 07/11/2018 Document Reviewed: 07/11/2018 Elsevier Patient Education  2020 Vanessa Richmond.

## 2020-02-26 LAB — CBC WITH DIFFERENTIAL/PLATELET
Basophils Absolute: 0.1 10*3/uL (ref 0.0–0.2)
Basos: 1 %
EOS (ABSOLUTE): 0.4 10*3/uL (ref 0.0–0.4)
Eos: 6 %
Hematocrit: 35.1 % (ref 34.0–46.6)
Hemoglobin: 11.2 g/dL (ref 11.1–15.9)
Immature Grans (Abs): 0 10*3/uL (ref 0.0–0.1)
Immature Granulocytes: 0 %
Lymphocytes Absolute: 2.2 10*3/uL (ref 0.7–3.1)
Lymphs: 32 %
MCH: 27.8 pg (ref 26.6–33.0)
MCHC: 31.9 g/dL (ref 31.5–35.7)
MCV: 87 fL (ref 79–97)
Monocytes Absolute: 0.5 10*3/uL (ref 0.1–0.9)
Monocytes: 7 %
Neutrophils Absolute: 3.7 10*3/uL (ref 1.4–7.0)
Neutrophils: 54 %
Platelets: 460 10*3/uL — ABNORMAL HIGH (ref 150–450)
RBC: 4.03 x10E6/uL (ref 3.77–5.28)
RDW: 21.2 % — ABNORMAL HIGH (ref 11.7–15.4)
WBC: 7 10*3/uL (ref 3.4–10.8)

## 2020-03-06 ENCOUNTER — Encounter (INDEPENDENT_AMBULATORY_CARE_PROVIDER_SITE_OTHER): Payer: Self-pay

## 2020-03-09 ENCOUNTER — Encounter: Payer: 59 | Admitting: Obstetrics and Gynecology

## 2020-03-16 ENCOUNTER — Encounter: Payer: Self-pay | Admitting: Certified Nurse Midwife

## 2020-03-16 ENCOUNTER — Other Ambulatory Visit: Payer: Self-pay

## 2020-03-16 ENCOUNTER — Ambulatory Visit (INDEPENDENT_AMBULATORY_CARE_PROVIDER_SITE_OTHER): Payer: Self-pay | Admitting: Obstetrics and Gynecology

## 2020-03-16 ENCOUNTER — Other Ambulatory Visit (HOSPITAL_COMMUNITY)
Admission: RE | Admit: 2020-03-16 | Discharge: 2020-03-16 | Disposition: A | Discharge status: Home / Self Care | Payer: Medicaid Other | Source: Ambulatory Visit | Attending: Obstetrics and Gynecology | Admitting: Obstetrics and Gynecology

## 2020-03-16 VITALS — BP 144/95 | HR 75 | Ht 62.0 in | Wt 184.2 lb

## 2020-03-16 DIAGNOSIS — N939 Abnormal uterine and vaginal bleeding, unspecified: Secondary | ICD-10-CM | POA: Diagnosis present

## 2020-03-16 DIAGNOSIS — Z789 Other specified health status: Secondary | ICD-10-CM

## 2020-03-16 NOTE — Progress Notes (Signed)
GYNECOLOGY OFFICE FOLLOW UP NOTE  History:  49 y.o. G3P0 here today for follow up for abnormal uterine bleeding. Recently was admitted to hospital for symptomatic anemia 2/2 abnormal uterine bleeding. Received iron and blood transfusion. She is feeling slightly better since being in the hospital.  She is not bleeding any more with the medicine (megace). If she stops the medicine, the bleeding starts again. She is taking it BID. She tried stopping megace for 2 days and bleeding started again.   Periods are irregular, bleeds heavily for 5-6 days. Has not been through menopause. Longest time without a period was 2 months.   Was on OCPs to control bleeding as recently as Jan/Feb. That helped somewhat but she was not given a refill. No further workup aside from what was done in the hospital.   Past Medical History:  Diagnosis Date   GERD (gastroesophageal reflux disease)    Iron deficiency anemia    Past Surgical History:  Procedure Laterality Date   VAGINAL DELIVERY      Current Outpatient Medications:    ferrous sulfate 325 (65 FE) MG tablet, Take 1 tablet (325 mg total) by mouth 2 (two) times daily with a meal., Disp: 60 tablet, Rfl: 0   megestrol (MEGACE) 40 MG tablet, Take 40 mg by mouth daily., Disp: , Rfl:    senna (SENOKOT) 8.6 MG TABS tablet, Take 1 tablet (8.6 mg total) by mouth daily as needed for mild constipation., Disp: 120 tablet, Rfl: 1  The following portions of the patient's history were reviewed and updated as appropriate: allergies, current medications, past family history, past medical history, past social history, past surgical history and problem list.   Review of Systems:  Pertinent items noted in HPI and remainder of comprehensive ROS otherwise negative.   Objective:  Physical Exam BP (!) 144/95    Pulse 75    Ht 5\' 2"  (1.575 m)    Wt 184 lb 3.2 oz (83.6 kg)    BMI 33.69 kg/m  CONSTITUTIONAL: Well-developed, well-nourished female in no acute distress.    HENT:  Normocephalic, atraumatic. External right and left ear normal. Oropharynx is clear and moist EYES: Conjunctivae and EOM are normal. Pupils are equal, round, and reactive to light. No scleral icterus.  NECK: Normal range of motion, supple, no masses SKIN: Skin is warm and dry. No rash noted. Not diaphoretic. No erythema. No pallor. NEUROLOGIC: Alert and oriented to person, place, and time. Normal reflexes, muscle tone coordination. No cranial nerve deficit noted. PSYCHIATRIC: Normal mood and affect. Normal behavior. Normal judgment and thought content. CARDIOVASCULAR: Normal heart rate noted RESPIRATORY: Effort normal, no problems with respiration noted ABDOMEN: Soft, no distention noted.   PELVIC: Normal appearing external genitalia; normal appearing vaginal mucosa and cervix.  No abnormal discharge noted.  EMB done, see note MUSCULOSKELETAL: Normal range of motion. No edema noted.  Exam done with chaperone present.  Labs and Imaging No results found.  Assessment & Plan:   1. Abnormal uterine bleeding (AUB) EMB today, see note Recommended IUD as she had improvement with OCPs - reviewed risks/beneifts, she is agreeable - Surgical pathology( [place]/ [facility])  2. Language barrier Spanish translator used   Routine preventative health maintenance measures emphasized. Please refer to After Visit Summary for other counseling recommendations.   Return in about 4 weeks (around 04/13/2020) for IUD placement, in person, Followup.  Total face-to-face time with patient: 25 minutes. Over 50% of encounter was spent on counseling and coordination of care.  Baldemar Lenis, M.D. Attending Center for [employer] Midwife)

## 2020-03-16 NOTE — Patient Instructions (Signed)
Informacin sobre el dispositivo intrauterino Intrauterine Device Information El dispositivo intrauterino (DIU) es un dispositivo mdico que se coloca en el tero para evitar el embarazo. Es un dispositivo pequeo en forma de "T" del que cuelgan uno o dos hilos de nailon. Los hilos cuelgan de la parte inferior del tero (cuello uterino) para que el DIU pueda extraerse en el futuro. Hay dos tipos de DIU:  DIU hormonal. Este tipo de DIU est hecho de plstico y contiene la hormona progestina (progesterona sinttica). Un DIU hormonal puede durar entre 3 y 5aos.  DIU de cobre. Este tipo de DIU est envuelto por un alambre de cobre. Un DIU de cobre puede durar hasta 10aos. Cmo se coloca un DIU? El DIU se introduce a travs de la vagina y se coloca en el tero con un procedimiento mdico menor. El procedimiento exacto de colocacin del DIU puede variar segn el mdico y el hospital. Cmo funciona el DIU? La progesterona sinttica del DIU hormonal evita el embarazo de la siguiente manera:  Hace que el moco cervical se haga ms espeso a fin de evitar que los espermatozoides ingresen al tero.  Adelgaza el endometrio para evitar que el vulo fecundado se implante all. El cobre del DIU de cobre hace que el tero y las trompas de Falopio produzcan un lquido que destruye los espermatozoides, lo que evita el embarazo. Cules son las ventajas del DIU? Ventaja de los dos tipos de DIU  Es muy efectivo para prevenir el embarazo.  Es reversible. La mujer puede quedar embarazada poco tiempo despus de extraer el DIU.  Tiene poco mantenimiento y puede dejarse colocado durante un largo perodo.  No hay efectos secundarios relacionados con el estrgeno.  Se puede utilizar durante la lactancia.  No est asociado con el aumento de peso.  Puede colocarse inmediatamente despus de un parto, un aborto provocado o un aborto espontneo. Ventajas del DIU hormonal  Si se coloca antes de que transcurran  7das del inicio del perodo menstrual, funciona de inmediato despus de la insercin. Si el DIU hormonal se coloca en cualquier otro momento del ciclo, ser necesario que utilice un mtodo anticonceptivo adicional durante los 7das posteriores a la colocacin.  Puede hacer que los perodos menstruales sean ms livianos.  Puede reducir los clicos menstruales.  Se puede utilizar durante un perodo de 3a 5aos. Ventajas del DIU de cobre  Funciona de inmediato despus de la colocacin.  Puede utilizarse como un mtodo anticonceptivo de emergencia si se coloca antes de que pasen 5das despus de haber tenido relaciones sexuales sin proteccin.  No afecta a las hormonas naturales del cuerpo.  Se puede utilizar durante 10aos. Cules son las desventajas del DIU?  El DIU puede causar sangrado menstrual irregular durante un tiempo despus de su colocacin.  Puede sentir dolor durante la colocacin y tener clicos y sangrado vaginal posteriormente.  El DIU puede cortar el tero (perforacin uterina) al colocarlo. Esto es poco frecuente.  El DIU puede causar enfermedad plvica inflamatoria (EPI), que es una infeccin del tero y de las trompas de Falopio. Esto es poco frecuente; si ocurre, suele producirse durante los primeros 20das despus de colocar el DIU.  El DIU de cobre puede hacer que el flujo menstrual sea ms abundante y doloroso. Cmo se extrae el DIU?  Estar acostada boca arriba, con las rodillas flexionadas y los pies en los soportes (estribos).  Se introducir un dispositivo en la vagina para separar las paredes vaginales (espculo). Esto permitir que el mdico vea   los hilos del DIU.  El mdico usar un pequeo instrumento (frceps) para agarrar los hilos del DIU y tirar firmemente hasta extraerlo. Puede sentir algunas molestias durante la extraccin del DIU. El mdico puede recomendarle que tome analgsicos de venta libre, como ibuprofeno, antes del procedimiento.  Tambin puede tener una pequea cantidad de sangrado durante algunos das despus del procedimiento. El procedimiento exacto de extraccin del DIU puede variar segn el mdico y el hospital. Es el DIU una opcin adecuada para m? El mdico se asegurar de que el uso de un DIU sea adecuado para usted y analizar con usted las ventajas, las desventajas y los posibles efectos secundarios. Resumen  El dispositivo intrauterino (DIU) es un dispositivo mdico que se coloca en el tero para evitar el embarazo. Es un dispositivo pequeo en forma de "T" del que cuelgan uno o dos hilos de nailon.  El DIU hormonal contiene la hormona progestina (progesterona sinttica). El DIU de cobre tiene un alambre de cobre enrollado alrededor.  La progesterona sinttica del DIU hormonal evita el embarazo al espesar el moco cervical y adelgazar las paredes del tero. El cobre del DIU de cobre hace que el tero y las trompas de Falopio produzcan un lquido que destruye los espermatozoides, lo que evita el embarazo.  El DIU hormonal puede dejarse colocado de 3a 5aos. El DIU de cobre puede dejarse colocado hasta 10aos.  El mdico es quien coloca y extrae el DIU. Puede sentir un poco de dolor durante la colocacin y la extraccin. El mdico puede recomendarle que tome analgsicos de venta libre, como ibuprofeno, antes del procedimiento del DIU. Esta informacin no tiene como fin reemplazar el consejo del mdico. Asegrese de hacerle al mdico cualquier pregunta que tenga. Document Revised: 02/16/2017 Document Reviewed: 02/16/2017 Elsevier Patient Education  2020 Elsevier Inc.  

## 2020-03-16 NOTE — Progress Notes (Signed)
ENDOMETRIAL BIOPSY      Vanessa Richmond is a 49 y.o. G3P0 here for endometrial biopsy.  The indications for endometrial biopsy were reviewed.  Risks of the biopsy including cramping, bleeding, infection, uterine perforation, inadequate specimen and need for additional procedures were discussed. The patient states she understands and agrees to undergo procedure today. Consent was signed. Time out was performed.   Indications: aub Urine HCG: negative  A bivalve speculum was placed into the vagina and the cervix was easily visualized and was prepped with Betadine x2. A single-toothed tenaculum was placed on the anterior lip of the cervix to stabilize it. The 3 mm pipelle was introduced into the endometrial cavity without difficulty to a depth of 9 cm, and a moderate amount of tissue was obtained and sent to pathology. This was repeated for a total of 3 passes. The instruments were removed from the patient's vagina. Minimal bleeding from the cervix at the tenaculum was noted.   The patient tolerated the procedure well. Routine post-procedure instructions were given to the patient.    Will base further management on results of biopsy.  Baldemar Lenis, M.D. [facility])

## 2020-03-18 LAB — SURGICAL PATHOLOGY

## 2020-03-19 ENCOUNTER — Encounter: Payer: Self-pay | Admitting: *Deleted

## 2020-04-06 ENCOUNTER — Telehealth: Payer: Self-pay | Admitting: Obstetrics and Gynecology

## 2020-04-06 ENCOUNTER — Ambulatory Visit (INDEPENDENT_AMBULATORY_CARE_PROVIDER_SITE_OTHER): Payer: Self-pay | Admitting: Primary Care

## 2020-04-06 ENCOUNTER — Other Ambulatory Visit: Payer: Self-pay

## 2020-04-06 ENCOUNTER — Encounter (INDEPENDENT_AMBULATORY_CARE_PROVIDER_SITE_OTHER): Payer: Self-pay | Admitting: Primary Care

## 2020-04-06 ENCOUNTER — Ambulatory Visit (INDEPENDENT_AMBULATORY_CARE_PROVIDER_SITE_OTHER): Payer: Medicaid Other | Admitting: Primary Care

## 2020-04-06 VITALS — BP 137/90 | HR 89 | Temp 97.3°F | Ht 62.0 in | Wt 186.0 lb

## 2020-04-06 DIAGNOSIS — I10 Essential (primary) hypertension: Secondary | ICD-10-CM

## 2020-04-06 DIAGNOSIS — N939 Abnormal uterine and vaginal bleeding, unspecified: Secondary | ICD-10-CM

## 2020-04-06 MED ORDER — MEGESTROL ACETATE 40 MG PO TABS: 40.0000 mg | ORAL_TABLET | Freq: Every day | ORAL | 0 refills | Status: DC

## 2020-04-06 MED ORDER — MEGESTROL ACETATE 40 MG PO TABS: 40.0000 mg | ORAL_TABLET | Freq: Every day | ORAL | 0 refills | Status: AC

## 2020-04-06 NOTE — Patient Instructions (Signed)
Hipertensión en los adultos °Hypertension, Adult °El término hipertensión es otra forma de denominar a la presión arterial elevada. La presión arterial elevada fuerza al corazón a trabajar más para bombear la sangre. Esto puede causar problemas con el paso del tiempo. °Una lectura de presión arterial está compuesta por 2 números. Hay un número superior (sistólico) sobre un número inferior (diastólico). Lo ideal es tener la presión arterial por debajo de 120/80. Las elecciones saludables pueden ayudar a bajar la presión arterial, o tal vez necesite medicamentos para bajarla. °¿Cuáles son las causas? °Se desconoce la causa de esta afección. Algunas afecciones pueden estar relacionadas con la presión arterial alta. °¿Qué incrementa el riesgo? °· Fumar. °· Tener diabetes mellitus tipo 2, colesterol alto, o ambos. °· No hacer la cantidad suficiente de actividad física o ejercicio. °· Tener sobrepeso. °· Consumir mucha grasa, azúcar, calorías o sal (sodio) en su dieta. °· Beber alcohol en exceso. °· Tener una enfermedad renal a largo plazo (crónica). °· Tener antecedentes familiares de presión arterial alta. °· Edad. Los riesgos aumentan con la edad. °· Raza. El riesgo es mayor para las personas afroamericanas. °· Sexo. Antes de los 45 años, los hombres corren más riesgo que las mujeres. Después de los 65 años, las mujeres corren más riesgo que los hombres. °· Tener apnea obstructiva del sueño. °· Estrés. °¿Cuáles son los signos o los síntomas? °· Es posible que la presión arterial alta puede no cause síntomas. La presión arterial muy alta (crisis hipertensiva) puede provocar: °? Dolor de cabeza. °? Sensaciones de preocupación o nerviosismo (ansiedad). °? Falta de aire. °? Hemorragia nasal. °? Sensación de malestar en el estómago (náuseas). °? Vómitos. °? Cambios en la forma de ver. °? Dolor muy intenso en el pecho. °? Convulsiones. °¿Cómo se trata? °· Esta afección se trata haciendo cambios saludables en el estilo de  vida, por ejemplo: °? Consumir alimentos saludables. °? Hacer más ejercicio. °? Beber menos alcohol. °· El médico puede recetarle medicamentos si los cambios en el estilo de vida no son suficientes para lograr controlar la presión arterial y si: °? El número de arriba está por encima de 130. °? El número de abajo está por encima de 80. °· Su presión arterial personal ideal puede variar. °Siga estas instrucciones en su casa: °Comida y bebida ° °· Si se lo dicen, siga el plan de alimentación de DASH (Dietary Approaches to Stop Hypertension, Maneras de alimentarse para detener la hipertensión). Para seguir este plan: °? Llene la mitad del plato de cada comida con frutas y verduras. °? Llene un cuarto del plato de cada comida con cereales integrales. Los cereales integrales incluyen pasta integral, arroz integral y pan integral. °? Coma y beba productos lácteos con bajo contenido de grasa, como leche descremada o yogur bajo en grasas. °? Llene un cuarto del plato de cada comida con proteínas bajas en grasa (magras). Las proteínas bajas en grasa incluyen pescado, pollo sin piel, huevos, frijoles y tofu. °? Evite consumir carne grasa, carne curada y procesada, o pollo con piel. °? Evite consumir alimentos prehechos o procesados. °· Consuma menos de 1500 mg de sal por día. °· No beba alcohol si: °? El médico le indica que no lo haga. °? Está embarazada, puede estar embarazada o está tratando de quedar embarazada. °· Si bebe alcohol: °? Limite la cantidad que bebe a lo siguiente: °§ De 0 a 1 medida por día para las mujeres. °§ De 0 a 2 medidas por día para los hombres. °? Esté atento a   la cantidad de alcohol que hay en las bebidas que toma. En los [place], una medida equivale a una botella de cerveza de 12 oz (355 ml), un vaso de vino de 5 oz (148 ml) o un vaso de una bebida alcohólica de alta graduación de 1½ oz (44 ml). °Estilo de vida ° °· Trabaje con su médico para mantenerse en un peso saludable o para perder  peso. Pregúntele a su médico cuál es el peso recomendable para usted. °· Haga al menos 30 minutos de ejercicio la mayoría de los días de la semana. Estos pueden incluir caminar, nadar o andar en bicicleta. °· Realice al menos 30 minutos de ejercicio que fortalezca sus músculos (ejercicios de resistencia) al menos 3 días a la semana. Estos pueden incluir levantar pesas o hacer Pilates. °· No consuma ningún producto que contenga nicotina o tabaco, como cigarrillos, cigarrillos electrónicos y tabaco de mascar. Si necesita ayuda para dejar de fumar, consulte al médico. °· Controle su presión arterial en su casa tal como le indicó el médico. °· Concurra a todas las visitas de seguimiento como se lo haya indicado el médico. Esto es importante. °Medicamentos °· Tome los medicamentos de venta libre y los recetados solamente como se lo haya indicado el médico. Siga cuidadosamente las indicaciones. °· No omita las dosis de medicamentos para la presión arterial. Los medicamentos pierden eficacia si omite dosis. El hecho de omitir las dosis también aumenta el riesgo de otros problemas. °· Pregúntele a su médico a qué efectos secundarios o reacciones a los medicamentos debe prestar atención. °Comuníquese con un médico si: °· Piensa que tiene una reacción a los medicamentos que está tomando. °· Tiene dolores de cabeza frecuentes (recurrentes). °· Se siente mareado. °· Tiene hinchazón en los tobillos. °· Tiene problemas de visión. °Solicite ayuda inmediatamente si: °· Siente un dolor de cabeza muy intenso. °· Empieza a sentirse desorientado (confundido). °· Se siente débil o adormecido. °· Siente que va a desmayarse. °· Tiene un dolor muy intenso en las siguientes zonas: °? Pecho. °? Vientre (abdomen). °· Vomita más de una vez. °· Tiene dificultad para respirar. °Resumen °· El término hipertensión es otra forma de denominar a la presión arterial elevada. °· La presión arterial elevada fuerza al corazón a trabajar más para bombear  la sangre. °· Para la mayoría de las personas, una presión arterial normal es menor que 120/80. °· Las decisiones saludables pueden ayudarle a disminuir su presión arterial. Si no puede bajar su presión arterial mediante decisiones saludables, es posible que deba tomar medicamentos. °Esta información no tiene como fin reemplazar el consejo del médico. Asegúrese de hacerle al médico cualquier pregunta que tenga. °Document Revised: 03/01/2018 Document Reviewed: 03/01/2018 °Elsevier Patient Education © 2020 Elsevier Inc. ° °

## 2020-04-06 NOTE — Progress Notes (Signed)
Established Patient Office Visit  Subjective:  Patient ID: Vanessa Richmond, female    DOB: [birth year]  Age: 49 y.o. MRN: [id number]  CC:  Chief Complaint  Patient presents with  . Blood Pressure Check    HPI Ms. Vanessa Richmond is a 49 year old Hispanic female Spanish speaking only (interputor Albin Felling [phone] ) presents for  Blood pressure follow up . Bp slightly elevated but in a lot of pelvic pain . Pain unbearable since Saturday in pelvic area and passing large clots. Showed a pictured appeared to abnormal shape clot asked to save and show GYN. GYN offered appointment today but due to her husband and it was a female doctor not able to go appointment rescheduled until next Monday.    Past Medical History:  Diagnosis Date  . GERD (gastroesophageal reflux disease)   . Iron deficiency anemia     Past Surgical History:  Procedure Laterality Date  . VAGINAL DELIVERY      No family history on file.  Social History   Socioeconomic History  . Marital status: Married    Spouse name: Not on file  . Number of children: Not on file  . Years of education: Not on file  . Highest education level: Not on file  Occupational History  . Not on file  Tobacco Use  . Smoking status: Never Smoker  . Smokeless tobacco: Never Used  Substance and Sexual Activity  . Alcohol use: No  . Drug use: No  . Sexual activity: Yes    Birth control/protection: None  Other Topics Concern  . Not on file  Social History Narrative  . Not on file   Social Determinants of Health   Financial Resource Strain:   . Difficulty of Paying Living Expenses: Not on file  Food Insecurity:   . Worried About Programme researcher, broadcasting/film/video in the Last Year: Not on file  . Ran Out of Food in the Last Year: Not on file  Transportation Needs:   . Lack of Transportation (Medical): Not on file  . Lack of Transportation (Non-Medical): Not on file  Physical Activity:   . Days of Exercise per Week: Not on file  . Minutes of Exercise per  Session: Not on file  Stress:   . Feeling of Stress : Not on file  Social Connections:   . Frequency of Communication with Friends and Family: Not on file  . Frequency of Social Gatherings with Friends and Family: Not on file  . Attends Religious Services: Not on file  . Active Member of Clubs or Organizations: Not on file  . Attends Banker Meetings: Not on file  . Marital Status: Not on file  Intimate Partner Violence:   . Fear of Current or Ex-Partner: Not on file  . Emotionally Abused: Not on file  . Physically Abused: Not on file  . Sexually Abused: Not on file    Outpatient Medications Prior to Visit  Medication Sig Dispense Refill  . ferrous sulfate 325 (65 FE) MG tablet Take 1 tablet (325 mg total) by mouth 2 (two) times daily with a meal. 60 tablet 0  . senna (SENOKOT) 8.6 MG TABS tablet Take 1 tablet (8.6 mg total) by mouth daily as needed for mild constipation. 120 tablet 1  . megestrol (MEGACE) 40 MG tablet Take 40 mg by mouth daily.     No facility-administered medications prior to visit.    No Known Allergies  ROS Review of Systems  Genitourinary: Positive for menstrual problem, pelvic pain, vaginal bleeding and vaginal pain.  All other systems reviewed and are negative.     Objective:    Physical Exam Vitals reviewed.  Constitutional:      Appearance: She is obese.  HENT:     Head: Normocephalic.  Eyes:     Extraocular Movements: Extraocular movements intact.  Cardiovascular:     Rate and Rhythm: Normal rate and regular rhythm.  Pulmonary:     Effort: Pulmonary effort is normal.     Breath sounds: Normal breath sounds.  Abdominal:     General: Bowel sounds are normal.     Palpations: Abdomen is soft.     Tenderness: There is abdominal tenderness.  Musculoskeletal:        General: Normal range of motion.     Cervical back: Normal range of motion and neck supple.  Skin:    General: Skin is warm and dry.  Neurological:     Mental  Status: She is alert and oriented to person, place, and time.  Psychiatric:        Mood and Affect: Mood normal.        Behavior: Behavior normal.        Thought Content: Thought content normal.        Judgment: Judgment normal.     BP 137/90 (BP Location: Right Arm, Patient Position: Sitting, Cuff Size: Normal)   Pulse 89   Temp (!) 97.3 F (36.3 C) (Temporal)   Ht 5\' 2"  (1.575 m)   Wt 186 lb (84.4 kg)   LMP 03/19/2020 (Approximate)   SpO2 96%   BMI 34.02 kg/m  Wt Readings from Last 3 Encounters:  04/06/20 186 lb (84.4 kg)  03/16/20 184 lb 3.2 oz (83.6 kg)  02/25/20 181 lb 6.4 oz (82.3 kg)     Health Maintenance Due  Topic Date Due  . Hepatitis C Screening  Never done  . PAP SMEAR-Modifier  Never done  . MAMMOGRAM  Never done  . COLONOSCOPY  Never done    There are no preventive care reminders to display for this patient.  Lab Results  Component Value Date   TSH 2.194 02/12/2020   Lab Results  Component Value Date   WBC 7.0 02/25/2020   HGB 11.2 02/25/2020   HCT 35.1 02/25/2020   MCV 87 02/25/2020   PLT 460 (H) 02/25/2020   Lab Results  Component Value Date   NA 134 (L) 02/14/2020   K 3.5 02/14/2020   CO2 22 02/14/2020   GLUCOSE 91 02/14/2020   BUN 10 02/14/2020   CREATININE 0.75 02/14/2020   BILITOT 0.5 02/13/2020   ALKPHOS 64 02/13/2020   AST 18 02/13/2020   ALT 15 02/13/2020   PROT 5.5 (L) 02/13/2020   ALBUMIN 2.9 (L) 02/13/2020   CALCIUM 8.4 (L) 02/14/2020   ANIONGAP 6 02/14/2020      Assessment & Plan:   Kyleigha was seen today for blood pressure check.  Diagnoses and all orders for this visit:  Abnormal uterine bleeding pelvic area and passing large clots. Showed a pictured appeared to abnormal shape clot asked to save and show GYN. Requesting medication to stop blood and clots   Elevated blood pressure reading with diagnosis of hypertension Elevated Bp today underlying cause AUB , and pain. Discussed Counseled on blood pressure goal of  less than 130/80, low-sodium, DASH diet, medication compliance, 150 minutes of moderate intensity exercise per week. No Bp meds at this time ,  monitor diet   Follow-up: Return in about 3 months (around 07/07/2020) for Bp follow up .    Grayce Sessions, NP

## 2020-04-06 NOTE — Telephone Encounter (Signed)
Attempted to contact pt at both contact numbers with [facility] Interpreter # [phone] and was unable to leave message due to message stating person you are trying to reach is unavailable.    Addison Naegeli, RN

## 2020-04-06 NOTE — Progress Notes (Signed)
Has been bleeding for the last 3 weeks  Pain in uterus  Passed something large on Saturday- could be a blood clot

## 2020-04-06 NOTE — Telephone Encounter (Signed)
Patient came to the office today state she is still having a lot of bleeding and a lot of pain, offered patient an appointment today at 3:15pm, she didn't want to take the appointment because it's with another provider, she want to speak to a nurse before her appointment next Monday

## 2020-04-13 ENCOUNTER — Encounter: Payer: Self-pay | Admitting: Obstetrics and Gynecology

## 2020-04-13 ENCOUNTER — Other Ambulatory Visit: Payer: Self-pay

## 2020-04-13 ENCOUNTER — Ambulatory Visit (INDEPENDENT_AMBULATORY_CARE_PROVIDER_SITE_OTHER): Payer: Medicaid Other | Admitting: Obstetrics and Gynecology

## 2020-04-13 VITALS — BP 132/85 | HR 79 | Ht 62.0 in | Wt 186.0 lb

## 2020-04-13 DIAGNOSIS — Z3202 Encounter for pregnancy test, result negative: Secondary | ICD-10-CM

## 2020-04-13 DIAGNOSIS — Z789 Other specified health status: Secondary | ICD-10-CM

## 2020-04-13 DIAGNOSIS — N939 Abnormal uterine and vaginal bleeding, unspecified: Secondary | ICD-10-CM

## 2020-04-13 DIAGNOSIS — Z30430 Encounter for insertion of intrauterine contraceptive device: Secondary | ICD-10-CM

## 2020-04-13 LAB — POCT PREGNANCY, URINE: Preg Test, Ur: NEGATIVE

## 2020-04-13 MED ORDER — LEVONORGESTREL 19.5 MCG/DAY IU IUD
INTRAUTERINE_SYSTEM | Freq: Once | INTRAUTERINE | Status: AC
  Administered 2020-04-13: 1 via INTRAUTERINE

## 2020-04-13 NOTE — Progress Notes (Signed)
    IUD INSERTION PROCEDURE NOTE  Vanessa Richmond is a 49 y.o. G3P0 here for Liletta insertion for AUB. No GYN concerns.   She was counseled regarding the risks/benefits of IUD including insertion risk of infection, hemorrhage, damage to surrounding tissue and organs, uterine perforation. She was counseled regarding risks of IUD including implantation into uterine wall, malpositioning, misplacement out of the uterus, migration outside of uterus, possible need for hysteroscopic or laparoscopic removal, ovarian cysts, expulsion. She was advised that risk of pregnancy is low with negative UPT but is not zero and IUD insertion may cause miscarriage. Reviewed that she is also at slightly higher risk for ectopic pregnancy and she should take a pregnancy test if she believes she may be pregnant. She was advised to use backup method of protection for one week. She verbalized understanding of all of the above and consent signed.   Last pap smear was NEEDS TO BE DONE UPT today: negative  IUD Insertion  Patient identified and an adequate time out was performed. Speculum placed in the vagina. The cervix was cleaned with Betadine x 2 and grasped anteriorly with a single tooth tenaculum.  A uterine sound was used to sound the uterus to 8.5 cm;  the IUD was then placed per manufacturer's recommendations. Strings trimmed to 3 cm. Tenaculum was removed, good hemostasis noted. Patient tolerated procedure well.   Patient was given post-procedure instructions.  She was reminded to have backup contraception for one week during this transition period between IUDs.  Patient was also asked to check IUD strings periodically and follow up in 4 weeks for IUD check.  Liletta IUD Exp: 07/2023 Lot: [id number]  K. Therese Sarah, M.D. [facility])

## 2020-04-13 NOTE — Addendum Note (Signed)
Addended by: Maxwell Marion E on: 04/13/2020 12:06 PM   Modules accepted: Orders

## 2020-04-13 NOTE — Patient Instructions (Signed)
Colocacin de un dispositivo intrauterino Intrauterine Device Insertion Un dispositivo intrauterino (DIU) es un dispositivo mdico que se coloca en el tero para impedir Firefighter. Es un dispositivo pequeo en forma de "T" del que cuelgan uno o dos hilos de [place]. Los hilos cuelgan de la parte inferior del tero (cuello uterino) para que el DIU pueda extraerse en el futuro. Hay dos tipos de DIU:  DIU de cobre. Este tipo de DIU est envuelto por un alambre de cobre. El cobre hace que el tero y las trompas de Falopio produzcan un lquido que [employer] espermatozoides. Un DIU de cobre puede durar hasta 10aos.  DIU hormonal. Este tipo de DIU est hecho de plstico y contiene la hormona progestina (progesterona sinttica). Esta hormona hace que la mucosidad del cuello uterino se haga ms espesa y evita que el esperma ingrese en el tero. Tambin hace que el endometrio sea ms delgado, lo que impide la implantacin del vulo fertilizado. La hormona debilita o destruye los espermatozoides que ingresan al tero. Un DIU hormonal puede durar entre 3 y 64aos. Informe al mdico acerca de lo siguiente:  Cualquier alergia que tenga.  Todos los [employer], incluidos vitaminas, hierbas, gotas oftlmicas, cremas y [address] de [address].  Cualquier problema que usted o sus familiares hayan tenido con anestsicos.  Cualquier enfermedad de la sangre que tenga.  Cirugas a las que se someti.  Todas las afecciones que tenga, incluidas las ITS (infecciones de transmisin sexual) que Production designer, theatre/television/film.  Si est embarazada o podra estarlo. Cules son los riesgos? En general, se trata de un procedimiento seguro. Sin embargo, pueden ocurrir complicaciones, por ejemplo:  Infeccin.  Hemorragia.  Reacciones alrgicas a los medicamentos.  Puncin accidental (perforacin) en el tero o dao a otras estructuras u otros rganos.  La colocacin accidental del DIU en la capa muscular del  tero (miometrio) o fuera del tero.  Que el DIU se salga del tero (expulsin). Esto es ms frecuente en las mujeres que han dado a Jenese recientemente.  Un embarazo que ocurre en las trompas de Falopio (embarazo ectpico).  Infeccin del tero y de las trompas de Falopio (enfermedad plvica inflamatoria). Qu ocurre antes del procedimiento?  Programe la colocacin del DIU para cuando tenga el perodo menstrual o inmediatamente despus para asegurarse de que no est embarazada. La colocacin del DIU se tolera mejor poco despus del ciclo menstrual.  Siga las indicaciones del mdico respecto de las restricciones de comidas o bebidas.  Consulte a su mdico si debe cambiar o suspender los medicamentos que toma habitualmente. Esto es muy importante si toma medicamentos para la diabetes o anticoagulantes.  Quizs le indiquen que tome un analgsico antes del procedimiento.  Es posible que deba hacerse estudios de lo siguiente: ? Psychiatrist. Neomia Dear prueba de Photographer una muestra de [place]. ? ITS. Colocar un DIU a una mujer que tiene una ITS puede empeorar la afeccin. ? Cncer de cuello uterino. Pueden realizarle una prueba de Papanicolaou para descartar este tipo de cncer. Para ello, se toman clulas del cuello uterino, que luego se examinan con un microscopio.  Es posible que Production manager un examen fsico para Therapist, occupational y la posicin del tero. Este procedimiento puede variar segn el mdico y el hospital. Ladell Heads ocurre durante el procedimiento?  Le colocarn un instrumento (espculo) en la vagina, y este se ensanchar para que el mdico pueda ver el cuello uterino.  Es posible que le coloquen medicamentos en el cuello uterino  para disminuir el riesgo de contraer una infeccin (antisptico).  Tal vez le administren un anestsico para adormecer cada lado del cuello uterino (bloqueo intracervical o bloqueo paracervical). Este medicamento suele administrarse mediante una  inyeccin en el cuello uterino.  Se introducir un instrumento (sonda uterina) en el tero para determinar la longitud del tero y la direccin hacia la cual el tero podra estar inclinado.  Se introducir un instrumento o un tubo delgado (insertador de DIU) que sostiene el DIU en la vagina, a travs del conducto endocervical, hasta el tero.  El DIU se colocar en el tero, y el insertador de DIU se Brewing technologist.  Los hilos unidos al DIU se cortarn para que queden justo debajo del cuello uterino. Este procedimiento puede variar segn el mdico y el hospital. Ladell Heads ocurre despus del procedimiento?  Podr tener sangrado despus del procedimiento. Esto es normal. Vara desde un goteo ligero (manchado) durante un par de [employer] un sangrado similar al de la [place].  Puede sentir clicos y Engineer, mining.  Puede sentirse mareada o aturdida.  Puede sentir dolor en la parte inferior de la espalda. Resumen  Un dispositivo intrauterino (DIU) es un dispositivo pequeo en forma de "T" del que cuelgan uno o dos hilos de [place].  Hay dos tipos de DIU. Pueden colocarle un DIU de cobre o un DIU hormonal.  Programe la colocacin del DIU para cuando tenga el perodo menstrual o inmediatamente despus para asegurarse de que no est embarazada. La colocacin del DIU se tolera mejor poco despus del ciclo menstrual.  Podr tener sangrado despus del procedimiento. Esto es normal. Vara desde un goteo ligero durante un par de [employer] un sangrado similar al menstrual. Esta informacin no tiene Theme park manager el consejo del mdico. Asegrese de hacerle al mdico cualquier pregunta que tenga. Document Revised: 02/16/2017 Document Reviewed: 02/16/2017 Elsevier Patient Education  2020 [employer].

## 2020-04-13 NOTE — Progress Notes (Signed)
GYNECOLOGY OFFICE FOLLOW UP NOTE  History:  49 y.o. G3P0 here today for follow up for discussion of abnormal uterine bleeding.  Had EMB 1 month ago.  Then, 2 weeks ago, passed a large "mass." She bled for 3 weeks prior to this. She reports she expelled a big mass that she thought was a baby at first. She had significant cramping, taking ibuprofen every 5-6 hours, and couldn't move for 3 days, felt like when she had a baby. Then had heavy bleeding for 4 days after this happened, then lightened and it has been less now.   Pt has picture with her today on phone of mass, unable to tell size but appears as if tissue was expelled, not just blood clot.  Past Medical History:  Diagnosis Date  . GERD (gastroesophageal reflux disease)   . Iron deficiency anemia    Past Surgical History:  Procedure Laterality Date  . VAGINAL DELIVERY      Current Outpatient Medications:  .  ferrous sulfate 325 (65 FE) MG tablet, Take 1 tablet (325 mg total) by mouth 2 (two) times daily with a meal., Disp: 60 tablet, Rfl: 0 .  megestrol (MEGACE) 40 MG tablet, Take 1 tablet (40 mg total) by mouth daily., Disp: 14 tablet, Rfl: 0 .  senna (SENOKOT) 8.6 MG TABS tablet, Take 1 tablet (8.6 mg total) by mouth daily as needed for mild constipation., Disp: 120 tablet, Rfl: 1  The following portions of the patient's history were reviewed and updated as appropriate: allergies, current medications, past family history, past medical history, past social history, past surgical history and problem list.   Review of Systems:  Pertinent items noted in HPI and remainder of comprehensive ROS otherwise negative.   Objective:  Physical Exam BP 132/85   Pulse 79   Ht 5\' 2"  (1.575 m)   Wt 186 lb (84.4 kg)   LMP 03/19/2020 (Approximate)   BMI 34.02 kg/m  CONSTITUTIONAL: Well-developed, well-nourished female in no acute distress.  HENT:  Normocephalic, atraumatic. External right and left ear normal. Oropharynx is clear and  moist EYES: Conjunctivae and EOM are normal. Pupils are equal, round, and reactive to light. No scleral icterus.  NECK: Normal range of motion, supple, no masses SKIN: Skin is warm and dry. No rash noted. Not diaphoretic. No erythema. No pallor. NEUROLOGIC: Alert and oriented to person, place, and time. Normal reflexes, muscle tone coordination. No cranial nerve deficit noted. PSYCHIATRIC: Normal mood and affect. Normal behavior. Normal judgment and thought content. CARDIOVASCULAR: Normal heart rate noted RESPIRATORY: Effort normal, no problems with respiration noted ABDOMEN: Soft, no distention noted.   PELVIC: Normal appearing external genitalia; normal appearing vaginal mucosa and cervix.  No abnormal discharge noted.  Normal uterine size, no other palpable masses, no uterine or adnexal tenderness. IUD insertion, see note MUSCULOSKELETAL: Normal range of motion. No edema noted.  Exam done with chaperone present.  Labs and Imaging No results found.  Assessment & Plan:   1. Abnormal uterine bleeding (AUB) - ongoing heavy bleeding including passage of large clot/tissue - Reviewed options for management including IUD and surgery, including risks of surgery, cost, time for recovery, IUD availability in office, low cost, ability to have surgery eventually if desires - she opts to proceed with IUD today - see note for insertion  2. Language barrier 03/21/2020 used   Routine preventative health maintenance measures emphasized. Please refer to After Visit Summary for other counseling recommendations.   Return in about 4 weeks (  around 05/11/2020) for string check.  Total face-to-face time with patient: 25 minutes. Over 50% of encounter was spent on counseling and coordination of care.  Baldemar Lenis, M.D. Attending Center for [employer] Midwife)

## 2020-04-15 ENCOUNTER — Encounter: Payer: Self-pay | Admitting: *Deleted

## 2020-04-15 ENCOUNTER — Ambulatory Visit: Payer: Self-pay | Admitting: Clinical

## 2020-04-15 DIAGNOSIS — Z91199 Patient's noncompliance with other medical treatment and regimen due to unspecified reason: Secondary | ICD-10-CM

## 2020-04-15 DIAGNOSIS — Z5329 Procedure and treatment not carried out because of patient's decision for other reasons: Secondary | ICD-10-CM

## 2020-04-15 NOTE — BH Specialist Note (Signed)
No charge for this visit due to [facility] Intern seeing patient.  Patient no-showed today's appointment; Pt was sent a link to the virtual visit to her phone and intern attempted to call pt twice using interpreter [phone] [facility]. Voicemail was unable to be left due to voicemail box not being set up at this time. MyChart message will be sent to client to reschedule.  Hulda Marin, MSW, LCSW Integrated Behavioral Health Clinician Center for [facility] at [facility]   K. [employer]re[employer]

## 2020-05-11 ENCOUNTER — Encounter: Payer: Self-pay | Admitting: *Deleted

## 2020-05-11 ENCOUNTER — Ambulatory Visit: Payer: Medicaid Other | Admitting: Obstetrics and Gynecology

## 2020-05-11 NOTE — Progress Notes (Signed)
Doree Fudge [facility] appointment. Per discussion with provider I sent a MyChart message notifying her and informing her she may reschedule if having issues or desires. Bonita Quin, RN

## 2020-05-26 ENCOUNTER — Ambulatory Visit (INDEPENDENT_AMBULATORY_CARE_PROVIDER_SITE_OTHER): Payer: Medicaid Other | Admitting: Primary Care

## 2020-07-07 ENCOUNTER — Ambulatory Visit (INDEPENDENT_AMBULATORY_CARE_PROVIDER_SITE_OTHER): Payer: Medicaid Other | Admitting: Primary Care

## 2020-12-26 ENCOUNTER — Emergency Department (HOSPITAL_COMMUNITY)
Admission: EM | Admit: 2020-12-26 | Discharge: 2020-12-27 | Disposition: A | Discharge status: Home / Self Care | Payer: Medicaid Other | Attending: Emergency Medicine | Admitting: Emergency Medicine

## 2020-12-26 ENCOUNTER — Other Ambulatory Visit: Payer: Self-pay

## 2020-12-26 DIAGNOSIS — M79672 Pain in left foot: Secondary | ICD-10-CM | POA: Insufficient documentation

## 2020-12-26 DIAGNOSIS — M79671 Pain in right foot: Secondary | ICD-10-CM | POA: Insufficient documentation

## 2020-12-26 DIAGNOSIS — M549 Dorsalgia, unspecified: Secondary | ICD-10-CM | POA: Insufficient documentation

## 2020-12-26 NOTE — ED Triage Notes (Signed)
Pt has had bilateral foot/heel pain and right calf pain x 3 weeks. No injury

## 2020-12-27 LAB — COMPREHENSIVE METABOLIC PANEL
ALT: 19 U/L (ref 0–44)
AST: 17 U/L (ref 15–41)
Albumin: 3.8 g/dL (ref 3.5–5.0)
Alkaline Phosphatase: 88 U/L (ref 38–126)
Anion gap: 7 (ref 5–15)
BUN: 13 mg/dL (ref 6–20)
CO2: 25 mmol/L (ref 22–32)
Calcium: 9 mg/dL (ref 8.9–10.3)
Chloride: 107 mmol/L (ref 98–111)
Creatinine, Ser: 0.85 mg/dL (ref 0.44–1.00)
GFR, Estimated: >60 mL/min (ref >60)
Glucose, Bld: 93 mg/dL (ref 70–99)
Potassium: 3.9 mmol/L (ref 3.5–5.1)
Sodium: 139 mmol/L (ref 135–145)
Total Bilirubin: 0.8 mg/dL (ref 0.3–1.2)
Total Protein: 6.7 g/dL (ref 6.5–8.1)

## 2020-12-27 LAB — CBC WITH DIFFERENTIAL/PLATELET
Abs Immature Granulocytes: 0.04 10*3/uL (ref 0.00–0.07)
Basophils Absolute: 0.1 10*3/uL (ref 0.0–0.1)
Basophils Relative: 1 %
Eosinophils Absolute: 0.5 10*3/uL (ref 0.0–0.5)
Eosinophils Relative: 6 %
HCT: 40.4 % (ref 36.0–46.0)
Hemoglobin: 13 g/dL (ref 12.0–15.0)
Immature Granulocytes: 1 %
Lymphocytes Relative: 27 %
Lymphs Abs: 2.2 10*3/uL (ref 0.7–4.0)
MCH: 29.1 pg (ref 26.0–34.0)
MCHC: 32.2 g/dL (ref 30.0–36.0)
MCV: 90.6 fL (ref 80.0–100.0)
Monocytes Absolute: 0.6 10*3/uL (ref 0.1–1.0)
Monocytes Relative: 8 %
Neutro Abs: 4.8 10*3/uL (ref 1.7–7.7)
Neutrophils Relative %: 57 %
Platelets: 319 10*3/uL (ref 150–400)
RBC: 4.46 MIL/uL (ref 3.87–5.11)
RDW: 15 % (ref 11.5–15.5)
WBC: 8.3 10*3/uL (ref 4.0–10.5)
nRBC: 0 % (ref 0.0–0.2)

## 2020-12-27 MED ORDER — GABAPENTIN 100 MG PO CAPS: 100.0000 mg | ORAL_CAPSULE | Freq: Three times a day (TID) | ORAL | 0 refills | Status: AC

## 2020-12-27 MED ORDER — GABAPENTIN 100 MG PO CAPS: 100.0000 mg | ORAL_CAPSULE | Freq: Three times a day (TID) | ORAL | 0 refills | Status: DC

## 2020-12-27 MED ORDER — NAPROXEN 500 MG PO TABS: 500.0000 mg | ORAL_TABLET | Freq: Two times a day (BID) | ORAL | 0 refills | Status: AC

## 2020-12-27 MED ORDER — GABAPENTIN 100 MG PO CAPS
100.0000 mg | ORAL_CAPSULE | Freq: Once | ORAL | Status: AC
  Administered 2020-12-27: 100 mg via ORAL
  Filled 2020-12-27: qty 1

## 2020-12-27 MED ORDER — KETOROLAC TROMETHAMINE 60 MG/2ML IM SOLN
60.0000 mg | Freq: Once | INTRAMUSCULAR | Status: AC
  Administered 2020-12-27: 60 mg via INTRAMUSCULAR
  Filled 2020-12-27: qty 2

## 2020-12-27 MED ORDER — NAPROXEN 500 MG PO TABS: 500.0000 mg | ORAL_TABLET | Freq: Two times a day (BID) | ORAL | 0 refills | Status: DC

## 2020-12-27 NOTE — ED Notes (Signed)
PT call for vitals X4 no answer.

## 2020-12-27 NOTE — Care Management (Signed)
PCP assigned on patient instruction sheet, will have to call Monday to schedule

## 2020-12-27 NOTE — ED Notes (Signed)
Pt came to triage asking about wait to see Dr.  Rock Nephew had been taken out of Epic for no answer.  Pt states she has been in lobby.  Undischarged pt.

## 2020-12-27 NOTE — ED Provider Notes (Signed)
[facility] EMERGENCY DEPARTMENT Provider Note   CSN: [id number] Arrival date & time: 12/26/20  2050     History Chief Complaint  Patient presents with   Foot Pain    Vanessa Richmond is a 50 y.o. female.  HPI      50yo-year-old female with a history of symptomatic anemia, abnormal uterine bleeding, gastritis, presents with concern for bilateral foot pain for the past 3 weeks.  3 weeks has been having severe pain Itching, hurts sole of feet bilateral Tried bandages, cream, salty water Burning sensation in feet, affecting legs and going to hip Started on right one, at bottom of feet, itches, pins and needles Nothing makes it better Standing up makes it worse When wake up in the morning and stand up Varicose veins, working, itching Feels like burning sensation over bottom of foot and burns from bottom of foot up legs Back pain for a few months No loss of control of bowel or bladder Fever in sole of foot, helps to shower. 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 new boot-1.51mos No falls or trauma   Past Medical History:  Diagnosis Date   GERD (gastroesophageal reflux disease)    Iron deficiency anemia     Patient Active Problem List   Diagnosis Date Noted   Acute blood loss anemia 02/13/2020   Symptomatic anemia 02/12/2020   Abnormal uterine bleeding 02/12/2020   Stiffness of joints, not elsewhere classified, multiple sites 06/07/2011   ANEMIA-IRON DEFICIENCY 03/31/2010   ANXIETY 03/31/2010   GASTRITIS 03/31/2010   WEIGHT LOSS 03/31/2010   NAUSEA ALONE 03/31/2010   ABDOMINAL PAIN-RUQ 03/31/2010   ABDOMINAL PAIN-RLQ 03/31/2010   ABDOMINAL PAIN, EPIGASTRIC 02/05/2010    Past Surgical History:  Procedure Laterality Date   VAGINAL DELIVERY        OB History     Gravida  3   Para      Term      Preterm      AB      Living  3      SAB      IAB      Ectopic      Multiple      Live Births  3           No family history on file.  Social History   Tobacco Use   Smoking status: Never   Smokeless tobacco: Never  Substance Use Topics   Alcohol use: No   Drug use: No    Home Medications Prior to Admission medications   Medication Sig Start Date End Date Taking? Authorizing Provider  gabapentin (NEURONTIN) 100 MG capsule Take 1 capsule (100 mg total) by mouth 3 (three) times daily. 12/27/20 01/26/21 Yes Alvira Monday, MD  naproxen (NAPROSYN) 500 MG tablet Take 1 tablet (500 mg total) by mouth 2 (two) times daily with a meal. 12/27/20  Yes Alvira Monday, MD  ferrous sulfate 325 (65 FE) MG tablet Take 1 tablet (325 mg total) by mouth 2 (two) times daily with a meal. 02/14/20 04/13/20  [place], Alvira Philips, DO  megestrol (MEGACE) 40 MG tablet Take 1 tablet (40 mg total) by mouth daily. 04/06/20   Grayce Sessions, NP  senna (SENOKOT) 8.6 MG TABS tablet Take 1  tablet (8.6 mg total) by mouth daily as needed for mild constipation. 02/25/20   Grayce Sessions, NP    Allergies    Patient has no known allergies.  Review of Systems   Review of Systems  Constitutional:  Negative for fever.  HENT:  Negative for sore throat.   Eyes:  Negative for visual disturbance.  Respiratory:  Negative for cough and shortness of breath.   Cardiovascular:  Negative for chest pain.  Gastrointestinal:  Negative for abdominal pain (does now after waiting in ED overnight feels like gastritis), nausea and vomiting.  Genitourinary:  Negative for difficulty urinating.  Musculoskeletal:  Positive for back pain. Negative for neck pain.  Skin:  Negative for rash.  Neurological:  Negative for syncope, speech difficulty, weakness, numbness (burning pain, tingling) and headaches (did not prior to arrival, has now after waiting in ED  overnight without eating).   Physical Exam Updated Vital Signs BP 109/64   Pulse 61   Temp 98.6 F (37 C) (Oral)   Resp (!) 24   Ht 5\' 4"  (1.626 m)   Wt 83.9 kg   SpO2 100%   BMI 31.76 kg/m   Physical Exam Vitals and nursing note reviewed.  Constitutional:      General: She is not in acute distress.    Appearance: Normal appearance. She is not ill-appearing, toxic-appearing or diaphoretic.  HENT:     Head: Normocephalic.  Eyes:     Conjunctiva/sclera: Conjunctivae normal.  Cardiovascular:     Rate and Rhythm: Normal rate and regular rhythm.     Pulses: Normal pulses.  Pulmonary:     Effort: Pulmonary effort is normal. No respiratory distress.  Musculoskeletal:        General: No deformity or signs of injury.     Cervical back: No rigidity.  Skin:    General: Skin is warm and dry.     Coloration: Skin is not jaundiced or pale.  Neurological:     General: No focal deficit present.     Mental Status: She is alert and oriented to person, place, and time.     Comments: Normal strength bilateral LE Reports more pain with sensation on right but no numbness     ED Results / Procedures / Treatments   Labs (all labs ordered are listed, but only abnormal results are displayed) Labs Reviewed  CBC WITH DIFFERENTIAL/PLATELET  COMPREHENSIVE METABOLIC PANEL  I-STAT CHEM 8, ED    EKG EKG Interpretation  Date/Time:  Sunday December 27 2020 10:33:10 EDT Ventricular Rate:  58 PR Interval:  201 QRS Duration: 89 QT Interval:  436 QTC Calculation: 429 R Axis:   37 Text Interpretation: Sinus rhythm Anteroseptal infarct, old No significant change since last tracing Confirmed by Liviya Santini ([id number]) on 12/27/2020 10:53:24 AM  Radiology No results found.  Procedures Procedures   Medications Ordered in ED Medications  ketorolac (TORADOL) injection 60 mg (60 mg Intramuscular Given 12/27/20 1104)  gabapentin (NEURONTIN) capsule 100 mg (100 mg Oral Given 12/27/20 1104)    ED  Course  I have reviewed the triage vital signs and the nursing notes.  Pertinent labs & imaging results that were available during my care of the patient were reviewed by me and considered in my medical decision making (see chart for details).    MDM Rules/Calculators/A&P                            50 yo-year-old female  with a history of symptomatic anemia, abnormal uterine bleeding, gastritis, presents with concern for bilateral foot pain for the past 3 weeks.  She has normal arterial pulses bilaterally, no sign of acute arterial thrombus, doubt dissection.  Have low suspicion for bilateral DVTs, and pain primarily in feet with radiation upwards and feel history is not consistent with this.  Suspect possible plantar fasciitis related to new boots that she has to wear at work, given primary location of pain and pain worse first thing in the morning, however consider other possible neuropathy or radicular pain from back given back pain and pain at times going up her legs bilaterally.  She does not have signs of back pain red flags, has normal strength, I do not see need for emergent surgery.  Attempted to obtain an i-STAT labs to evaluate for electrolyte abnormalities as a cause of her neuropathic pain, however the i-STAT lab I suspect is inaccurate, and we will send repeat to the lab for evaluation. EKG shows no acute changes related to hyperkalemia.   CBC and CMP shows no acute abnormalities..  Placed order for [facility] for PCP follow up. Given rx for naproxen and gabapentin and information about plantar fasciitiis/neuropathy.   Final Clinical Impression(s) / ED Diagnoses Final diagnoses:  Bilateral foot pain    Rx / DC Orders ED Discharge Orders          Ordered    gabapentin (NEURONTIN) 100 MG capsule  3 times daily        12/27/20 1214    naproxen (NAPROSYN) 500 MG tablet  2 times daily with meals        12/27/20 1214             Alvira Monday, MD 12/27/20 1223

## 2021-05-15 ENCOUNTER — Emergency Department (HOSPITAL_BASED_OUTPATIENT_CLINIC_OR_DEPARTMENT_OTHER): Payer: Self-pay | Admitting: Radiology

## 2021-05-15 ENCOUNTER — Other Ambulatory Visit: Payer: Self-pay

## 2021-05-15 ENCOUNTER — Encounter (HOSPITAL_BASED_OUTPATIENT_CLINIC_OR_DEPARTMENT_OTHER): Payer: Self-pay | Admitting: Emergency Medicine

## 2021-05-15 ENCOUNTER — Emergency Department (HOSPITAL_BASED_OUTPATIENT_CLINIC_OR_DEPARTMENT_OTHER)
Admission: EM | Admit: 2021-05-15 | Discharge: 2021-05-15 | Disposition: A | Discharge status: Home / Self Care | Payer: Self-pay | Attending: Emergency Medicine | Admitting: Emergency Medicine

## 2021-05-15 DIAGNOSIS — R059 Cough, unspecified: Secondary | ICD-10-CM | POA: Insufficient documentation

## 2021-05-15 DIAGNOSIS — R42 Dizziness and giddiness: Secondary | ICD-10-CM | POA: Insufficient documentation

## 2021-05-15 DIAGNOSIS — R0789 Other chest pain: Secondary | ICD-10-CM | POA: Insufficient documentation

## 2021-05-15 DIAGNOSIS — Z20822 Contact with and (suspected) exposure to covid-19: Secondary | ICD-10-CM | POA: Insufficient documentation

## 2021-05-15 DIAGNOSIS — R197 Diarrhea, unspecified: Secondary | ICD-10-CM | POA: Insufficient documentation

## 2021-05-15 DIAGNOSIS — R6889 Other general symptoms and signs: Secondary | ICD-10-CM

## 2021-05-15 DIAGNOSIS — R5383 Other fatigue: Secondary | ICD-10-CM | POA: Insufficient documentation

## 2021-05-15 DIAGNOSIS — R519 Headache, unspecified: Secondary | ICD-10-CM | POA: Insufficient documentation

## 2021-05-15 DIAGNOSIS — R110 Nausea: Secondary | ICD-10-CM | POA: Insufficient documentation

## 2021-05-15 LAB — RESP PANEL BY RT-PCR (FLU A&B, COVID) ARPGX2
Influenza A by PCR: NEGATIVE
Influenza B by PCR: NEGATIVE
SARS Coronavirus 2 by RT PCR: NEGATIVE

## 2021-05-15 LAB — BASIC METABOLIC PANEL
Anion gap: 5 (ref 5–15)
BUN: 12 mg/dL (ref 6–20)
CO2: 29 mmol/L (ref 22–32)
Calcium: 9.1 mg/dL (ref 8.9–10.3)
Chloride: 105 mmol/L (ref 98–111)
Creatinine, Ser: 0.73 mg/dL (ref 0.44–1.00)
GFR, Estimated: >60 mL/min (ref >60)
Glucose, Bld: 90 mg/dL (ref 70–99)
Potassium: 3.8 mmol/L (ref 3.5–5.1)
Sodium: 139 mmol/L (ref 135–145)

## 2021-05-15 LAB — CBC
HCT: 39 % (ref 36.0–46.0)
Hemoglobin: 12.7 g/dL (ref 12.0–15.0)
MCH: 29.3 pg (ref 26.0–34.0)
MCHC: 32.6 g/dL (ref 30.0–36.0)
MCV: 89.9 fL (ref 80.0–100.0)
Platelets: 359 10*3/uL (ref 150–400)
RBC: 4.34 MIL/uL (ref 3.87–5.11)
RDW: 14.1 % (ref 11.5–15.5)
WBC: 7.6 10*3/uL (ref 4.0–10.5)
nRBC: 0 % (ref 0.0–0.2)

## 2021-05-15 LAB — TROPONIN I (HIGH SENSITIVITY): Troponin I (High Sensitivity): <2 ng/L (ref <18)

## 2021-05-15 MED ORDER — BENZONATATE 100 MG PO CAPS: 100.0000 mg | ORAL_CAPSULE | Freq: Three times a day (TID) | ORAL | 0 refills | Status: AC

## 2021-05-15 MED ORDER — ONDANSETRON HCL 4 MG PO TABS: 4.0000 mg | ORAL_TABLET | Freq: Four times a day (QID) | ORAL | 0 refills | Status: AC

## 2021-05-15 MED ORDER — HYDROXYZINE HCL 25 MG PO TABS: 25.0000 mg | ORAL_TABLET | Freq: Four times a day (QID) | ORAL | 0 refills | Status: AC

## 2021-05-15 NOTE — ED Provider Notes (Signed)
[facility] EMERGENCY DEPT Provider Note   CSN: [id number] Arrival date & time: 05/15/21  1528     History Chief Complaint  Patient presents with   Chest Pain   Back Pain    Vanessa Richmond is a 50 y.o. female with no pertinent past medical history presenting today with a complaint of URI symptoms.  She reports that there are multiple people at her job who are sick, but she does not know with what.  The day before yesterday she began to have headaches and some dizziness.  Yesterday it worsened and she started to have chills, a productive cough, fatigue, nausea and diarrhea.  She continues with the symptoms today.  Has not taken any at home test.  Does state that she has had a cough that has been going on for over a week but it comes and goes.  She has not measured any fevers but has had some chills.  No episodes of emesis.  History of cholecystectomy.  Last menstrual period 3 days ago, normal however she reports she occasionally misses a menstrual cycle.  She describes the chest discomfort as tightness with occasional difficulty taking a deep breath.  No history of ACS.  Past Medical History:  Diagnosis Date   GERD (gastroesophageal reflux disease)    Iron deficiency anemia     Patient Active Problem List   Diagnosis Date Noted   Acute blood loss anemia 02/13/2020   Symptomatic anemia 02/12/2020   Abnormal uterine bleeding 02/12/2020   Stiffness of joints, not elsewhere classified, multiple sites 06/07/2011   ANEMIA-IRON DEFICIENCY 03/31/2010   ANXIETY 03/31/2010   GASTRITIS 03/31/2010   WEIGHT LOSS 03/31/2010   NAUSEA ALONE 03/31/2010   ABDOMINAL PAIN-RUQ 03/31/2010   ABDOMINAL PAIN-RLQ 03/31/2010   ABDOMINAL PAIN, EPIGASTRIC 02/05/2010    Past Surgical History:  Procedure Laterality Date   VAGINAL DELIVERY       OB History     Gravida  3   Para      Term      Preterm      AB      Living  3      SAB      IAB      Ectopic      Multiple       Live Births  3           No family history on file.  Social History   Tobacco Use   Smoking status: Never   Smokeless tobacco: Never  Substance Use Topics   Alcohol use: No   Drug use: No    Home Medications Prior to Admission medications   Medication Sig Start Date End Date Taking? Authorizing Provider  ferrous sulfate 325 (65 FE) MG tablet Take 1 tablet (325 mg total) by mouth 2 (two) times daily with a meal. 02/14/20 04/13/20  [place], Alvira Philips, DO  gabapentin (NEURONTIN) 100 MG capsule Take 1 capsule (100 mg total) by mouth 3 (three) times daily. 12/27/20 01/26/21  Alvira Monday, MD  megestrol (MEGACE) 40 MG tablet Take 1 tablet (40 mg total) by mouth daily. 04/06/20   Grayce Sessions, NP  naproxen (NAPROSYN) 500 MG tablet Take 1 tablet (500 mg total) by mouth 2 (two) times daily with a meal. 12/27/20   Alvira Monday, MD  senna (SENOKOT) 8.6 MG TABS tablet Take 1 tablet (8.6 mg total) by mouth daily as needed for mild constipation. 02/25/20   Grayce Sessions, NP  Allergies    Patient has no known allergies.  Review of Systems   Review of Systems  Constitutional:  Positive for chills and fatigue.  HENT:  Positive for ear pain (right) and sore throat.   Respiratory:  Positive for cough (Productive of yellow sputum).   Cardiovascular:  Positive for chest pain.  Gastrointestinal:  Positive for diarrhea and nausea. Negative for vomiting.  Musculoskeletal:  Positive for back pain.  Neurological:  Positive for dizziness.   Physical Exam Updated Vital Signs BP (!) 136/98 (BP Location: Left Arm)    Pulse (!) 58    Temp 98 F (36.7 C)    Resp 18    Ht 5\' 4"  (1.626 m)    Wt 83.9 kg    LMP 05/11/2021    SpO2 100%    BMI 31.76 kg/m   Physical Exam Vitals and nursing note reviewed.  Constitutional:      Appearance: Normal appearance.  HENT:     Head: Normocephalic and atraumatic.     Right Ear: Tympanic membrane and ear canal normal.     Left Ear: Tympanic  membrane and ear canal normal.     Mouth/Throat:     Mouth: Mucous membranes are moist.     Pharynx: Oropharynx is clear. Posterior oropharyngeal erythema present. No oropharyngeal exudate.  Eyes:     General: No scleral icterus.    Conjunctiva/sclera: Conjunctivae normal.  Cardiovascular:     Rate and Rhythm: Normal rate and regular rhythm.  Pulmonary:     Effort: Pulmonary effort is normal. No respiratory distress.     Breath sounds: Normal breath sounds. No decreased breath sounds or wheezing.  Skin:    Findings: No rash.  Neurological:     Mental Status: She is alert.  Psychiatric:        Mood and Affect: Mood normal.    ED Results / Procedures / Treatments   Labs (all labs ordered are listed, but only abnormal results are displayed) Labs Reviewed  RESP PANEL BY RT-PCR (FLU A&B, COVID) ARPGX2  BASIC METABOLIC PANEL  CBC  TROPONIN I (HIGH SENSITIVITY)    EKG None  Radiology DG Chest 2 View  Result Date: 05/15/2021 CLINICAL DATA:  Chest pain.  Back pain.  Abdominal pain. EXAM: CHEST - 2 VIEW COMPARISON:  February 12, 2020 FINDINGS: The heart size and mediastinal contours are within normal limits. Both lungs are clear. The visualized skeletal structures are unremarkable. IMPRESSION: No active cardiopulmonary disease. Electronically Signed   By: February 14, 2020 III M.D.   On: 05/15/2021 16:42    Procedures Procedures   Medications Ordered in ED Medications - No data to display  ED Course  I have reviewed the triage vital signs and the nursing notes.  Pertinent labs & imaging results that were available during my care of the patient were reviewed by me and considered in my medical decision making (see chart for details).    MDM Rules/Calculators/A&P 50 year old female with no pertinent past medical history presenting today due to complaint of URI symptoms.  Works at [employer] with multiple sick contacts however her coworkers have not shared with illness they  have.  All of her symptoms have been developing over the past 2 days.  Originally triaged as chest pain however with further evaluation, in Spanish, she reported that the pain is more consistent with full body aches.  She also has a cough which exacerbates her chest tightness.  Workup: -Her work-up is negative today.  Blood work  without abnormalities, EKG sinus and troponin negative x1. -Tested negative for COVID and the flu.  Likely other cold/viral illness.  We discussed proper over-the-counter treatment of this and I have prescribed her Zofran and Tessalon Perles at this time.  She requested something for anxiety as well.  I give her hydroxyzine for the interim but she will follow-up with a long-term care provider for any stress or continued symptoms.  She is agreeable to discharge at this time.  Final Clinical Impression(s) / ED Diagnoses Final diagnoses:  Flu-like symptoms    Rx / DC Orders Results and diagnoses were explained to the patient via the interpreter. Return precautions discussed in full.  She had no additional questions and expressed complete understanding.     Saddie Benders, PA-C 05/15/21 1814    Gwyneth Sprout, MD 05/16/21 [phone]

## 2021-05-15 NOTE — ED Triage Notes (Signed)
Reports chest pain , back pain , headache , yesterday .  Abdominal pain , diarrhea , chills. Today.

## 2021-05-15 NOTE — ED Notes (Signed)
No acute distress noted upon this RN's departure of patient. PA at bedside to translate. Verified discharge paperwork with name and DOB. Vital signs stable. Patient taken to checkout window. Discharge paperwork discussed with patient. No further questions voiced upon discharge.

## 2021-05-15 NOTE — Discharge Instructions (Addendum)
Estoy enviando 3 medicamentos a su farmacia.  El primero, [place], es para las nuseas.  Puede usar esto como necesite [place].  El [place], benzonatate, es para que lo utilice para la tos solo cuando lo necesite.  Por ltimo, la hidroxicina es algo que puede usar para la ansiedad y para dormir.  Con este medicamento, es posible que le cause sueo.  Es mejor tomar esto por la noche, sin embargo, si siente que debe tomarlo, puede tomar la mitad de una pldora por la tarde despus de [place].  Fue un Dietitian, espero que te sientas mejor y [place]!

## 2022-03-15 ENCOUNTER — Emergency Department (HOSPITAL_BASED_OUTPATIENT_CLINIC_OR_DEPARTMENT_OTHER)
Admission: EM | Admit: 2022-03-15 | Discharge: 2022-03-15 | Disposition: A | Discharge status: Home / Self Care | Payer: Self-pay | Attending: Emergency Medicine | Admitting: Emergency Medicine

## 2022-03-15 ENCOUNTER — Encounter (HOSPITAL_BASED_OUTPATIENT_CLINIC_OR_DEPARTMENT_OTHER): Payer: Self-pay | Admitting: Emergency Medicine

## 2022-03-15 ENCOUNTER — Other Ambulatory Visit: Payer: Self-pay

## 2022-03-15 ENCOUNTER — Emergency Department (HOSPITAL_BASED_OUTPATIENT_CLINIC_OR_DEPARTMENT_OTHER): Payer: Self-pay | Admitting: Radiology

## 2022-03-15 DIAGNOSIS — Z1152 Encounter for screening for COVID-19: Secondary | ICD-10-CM | POA: Insufficient documentation

## 2022-03-15 DIAGNOSIS — J069 Acute upper respiratory infection, unspecified: Secondary | ICD-10-CM | POA: Insufficient documentation

## 2022-03-15 LAB — CBC
HCT: 41.3 % (ref 36.0–46.0)
Hemoglobin: 13.4 g/dL (ref 12.0–15.0)
MCH: 29.7 pg (ref 26.0–34.0)
MCHC: 32.4 g/dL (ref 30.0–36.0)
MCV: 91.6 fL (ref 80.0–100.0)
Platelets: 349 10*3/uL (ref 150–400)
RBC: 4.51 MIL/uL (ref 3.87–5.11)
RDW: 14.3 % (ref 11.5–15.5)
WBC: 8.1 10*3/uL (ref 4.0–10.5)
nRBC: 0 % (ref 0.0–0.2)

## 2022-03-15 LAB — RESP PANEL BY RT-PCR (FLU A&B, COVID) ARPGX2
Influenza A by PCR: NEGATIVE
Influenza B by PCR: NEGATIVE
SARS Coronavirus 2 by RT PCR: NEGATIVE

## 2022-03-15 LAB — BASIC METABOLIC PANEL
Anion gap: 10 (ref 5–15)
BUN: 12 mg/dL (ref 6–20)
CO2: 26 mmol/L (ref 22–32)
Calcium: 9.6 mg/dL (ref 8.9–10.3)
Chloride: 106 mmol/L (ref 98–111)
Creatinine, Ser: 0.71 mg/dL (ref 0.44–1.00)
GFR, Estimated: >60 mL/min (ref >60)
Glucose, Bld: 92 mg/dL (ref 70–99)
Potassium: 3.9 mmol/L (ref 3.5–5.1)
Sodium: 142 mmol/L (ref 135–145)

## 2022-03-15 LAB — TROPONIN I (HIGH SENSITIVITY): Troponin I (High Sensitivity): <2 ng/L (ref <18)

## 2022-03-15 MED ORDER — IBUPROFEN 800 MG PO TABS: 800.0000 mg | ORAL_TABLET | Freq: Three times a day (TID) | ORAL | 0 refills | Status: AC

## 2022-03-15 MED ORDER — BENZONATATE 100 MG PO CAPS
100.0000 mg | ORAL_CAPSULE | Freq: Once | ORAL | Status: AC
  Administered 2022-03-15: 100 mg via ORAL
  Filled 2022-03-15: qty 1

## 2022-03-15 MED ORDER — ACETAMINOPHEN 500 MG PO TABS: 500.0000 mg | ORAL_TABLET | Freq: Four times a day (QID) | ORAL | 0 refills | Status: AC | PRN

## 2022-03-15 MED ORDER — KETOROLAC TROMETHAMINE 15 MG/ML IJ SOLN
15.0000 mg | Freq: Once | INTRAMUSCULAR | Status: AC
  Administered 2022-03-15: 15 mg via INTRAMUSCULAR
  Filled 2022-03-15: qty 1

## 2022-03-15 MED ORDER — BENZONATATE 100 MG PO CAPS: 100.0000 mg | ORAL_CAPSULE | Freq: Three times a day (TID) | ORAL | 0 refills | Status: AC

## 2022-03-15 NOTE — ED Provider Notes (Signed)
[facility] EMERGENCY DEPT Provider Note   CSN: [id number] Arrival date & time: 03/15/22  1649     History  Chief Complaint  Patient presents with   Chest Pain    Vanessa Richmond is a 51 y.o. female with no pertinent past medical history presenting today with a complaint of multiple URI symptoms.  She says there are multiple people at her job who are sick and she believes it is with COVID.  Says that her symptoms have been going on for 2 weeks, previously had abdominal pain, body aches, headaches, weakness, nausea, cough and chest pain.  This week she no longer has abdominal pain or fevers.  She has tried Tylenol and ibuprofen at home but they do not fully relieve her symptoms.  Chest pain is worse when she coughs or moves around.  Also notes that she has had a cough for 5 years and nobody has ever given her medication for it.  Lots of people at work have covid.    Chest Pain Associated symptoms: abdominal pain, cough, dizziness, fatigue, fever (last week), headache and weakness        Home Medications Prior to Admission medications   Medication Sig Start Date End Date Taking? Authorizing Provider  ferrous sulfate 325 (65 FE) MG tablet Take 1 tablet (325 mg total) by mouth 2 (two) times daily with a meal. 02/14/20 04/13/20  [place], Alvira Philips, DO  gabapentin (NEURONTIN) 100 MG capsule Take 1 capsule (100 mg total) by mouth 3 (three) times daily. 12/27/20 01/26/21  Alvira Monday, MD  megestrol (MEGACE) 40 MG tablet Take 1 tablet (40 mg total) by mouth daily. 04/06/20   Grayce Sessions, NP  naproxen (NAPROSYN) 500 MG tablet Take 1 tablet (500 mg total) by mouth 2 (two) times daily with a meal. 12/27/20   Alvira Monday, MD  senna (SENOKOT) 8.6 MG TABS tablet Take 1 tablet (8.6 mg total) by mouth daily as needed for mild constipation. 02/25/20   Grayce Sessions, NP      Allergies    Patient has no known allergies.    Review of Systems   Review of Systems   Constitutional:  Positive for fatigue and fever (last week).  Respiratory:  Positive for cough.   Cardiovascular:  Positive for chest pain.  Gastrointestinal:  Positive for abdominal pain.  Neurological:  Positive for dizziness, weakness and headaches.    Physical Exam Updated Vital Signs BP (!) 159/101 (BP Location: Right Arm)   Pulse 60   Resp 16   LMP 05/11/2021   SpO2 100%  Physical Exam Vitals and nursing note reviewed.  Constitutional:      General: She is not in acute distress.    Appearance: Normal appearance. She is not ill-appearing.  HENT:     Head: Normocephalic and atraumatic.  Eyes:     General: No scleral icterus.    Conjunctiva/sclera: Conjunctivae normal.  Cardiovascular:     Rate and Rhythm: Normal rate and regular rhythm.     Heart sounds: Normal heart sounds.  Pulmonary:     Effort: Pulmonary effort is normal. No respiratory distress.     Breath sounds: Normal breath sounds.  Chest:     Chest wall: Tenderness present.     Comments: Reproducible chest wall pain Musculoskeletal:     Right lower leg: No tenderness. No edema.     Left lower leg: No tenderness. No edema.  Skin:    General: Skin is warm and dry.  Findings: No rash.  Neurological:     Mental Status: She is alert.  Psychiatric:        Mood and Affect: Mood normal.     ED Results / Procedures / Treatments   Labs (all labs ordered are listed, but only abnormal results are displayed) Labs Reviewed  RESP PANEL BY RT-PCR (FLU A&B, COVID) ARPGX2  BASIC METABOLIC PANEL  CBC  PREGNANCY, URINE  TROPONIN I (HIGH SENSITIVITY)    EKG EKG Interpretation  Date/Time:  Tuesday March 15 2022 17:06:25 EDT Ventricular Rate:  69 PR Interval:  180 QRS Duration: 74 QT Interval:  378 QTC Calculation: 405 R Axis:   20 Text Interpretation: Normal sinus rhythm Normal ECG No significant change since last tracing Confirmed by Melene Plan [phone]) on 03/15/2022 5:25:19 PM  Radiology DG Chest  2 View  Result Date: 03/15/2022 CLINICAL DATA:  Chest pain.  Headache and fever EXAM: CHEST - 2 VIEW COMPARISON:  Chest 05/15/2021 FINDINGS: The heart size and mediastinal contours are within normal limits. Both lungs are clear. The visualized skeletal structures are unremarkable. IMPRESSION: No active cardiopulmonary disease. Electronically Signed   By: Marlan Palau M.D.   On: 03/15/2022 17:50    Procedures Procedures   Medications Ordered in ED Medications - No data to display  ED Course/ Medical Decision Making/ A&P                           Medical Decision Making Amount and/or Complexity of Data Reviewed Labs: ordered. Radiology: ordered.  Risk OTC drugs. Prescription drug management.  51 year old female presenting with a complaint of chest pain, cough, fevers, back pain, nausea, abdominal pain and other viral symptoms.  Differential includes but is not limited to ACS, PE, viral illness, pneumonia.   This is not an exhaustive differential.    Past Medical History / Co-morbidities / Social History: Anxiety   Additional history: Per chart review patient had the exact same symptoms 10 months ago.  I personally saw her and referred her to primary care and she says that she has not seen anyone outpatient for her 5-year cough   Physical Exam: Pertinent physical exam findings include Chest pain is reproducible Sounds are clear No lower extremity edema  Lab Tests: I ordered, and personally interpreted labs.  The pertinent results include: Negative troponin x1 Negative viral panel Normal WBC   Imaging Studies: I ordered and independently visualized and interpreted CXR and I agree with the radiologist that there are no acute findings   Cardiac Monitoring:  The patient was maintained on a cardiac monitor.  I viewed and interpreted the cardiac monitored which showed an underlying rhythm of: Sinus   Medications: I ordered medication including benzonatate and Toradol    MDM/Disposition: This is a 51 year old female who checked in with chest pain.  Upon further history taking and there are multiple other URI symptoms in this complaint.  Did consider ACS however patient had a normal EKG and negative troponin.  Low heart score.  Also low likelihood Wells PE score.  This is likely a viral syndrome.  She will be treated with IM Toradol and given a benzonatate pill.  Ibuprofen, Tylenol and Tessalon have also been prescribed to her.  She has been instructed to follow-up with her PCP for any further concerns and return with any worsening symptoms.  Entire encounter was performed with iPad interpreter   Final Clinical Impression(s) / ED Diagnoses Final diagnoses:  Viral  upper respiratory illness    Rx / DC Orders ED Discharge Orders          Ordered    benzonatate (TESSALON) 100 MG capsule  Every 8 hours        03/15/22 1820    ibuprofen (ADVIL) 800 MG tablet  3 times daily        03/15/22 1820    acetaminophen (TYLENOL) 500 MG tablet  Every 6 hours PRN        03/15/22 1820           Results and diagnoses were explained to the patient. Return precautions discussed in full. Patient had no additional questions and expressed complete understanding.   This chart was dictated using voice recognition software.  Despite best efforts to proofread,  errors can occur which can change the documentation meaning.    Rhae Hammock, PA-C 03/15/22 [place], [place], DO 03/15/22 2204

## 2022-03-15 NOTE — ED Triage Notes (Signed)
Last week ha and fever cough Now with Chest and back pain for the last 3 days Muscle pain , weakness in legs

## 2022-03-15 NOTE — ED Notes (Signed)
Pt currently unable to provide urine sample. 

## 2022-03-15 NOTE — Discharge Instructions (Addendum)
Probablemente padezca una infeccin viral. Hace 2 semanas es posible que haya tenido COVID y es posible que en este momento est superando los sntomas. La fiebre y Conservation officer, historic buildings abdominal pueden ser los primeros sntomas que comiencen a Teacher, English as a foreign language.  He enviado los siguientes medicamentos a la Engineer, mining. 1. Benzonatato para la tos. Tmelo segn lo prescrito. Tambin puedes usar Delsym y Robitussin sin receta para la tos, no los tomes durante ms de 5 das seguidos. 2. Ibuprofeno y tylenol para el dolor y los dolores corporales.  Su anlisis de laboratorio parece normal y no tiene neumona. Pngase en contacto con su proveedor de atencin primaria para seguir sus sntomas y tos crnica.  (You are likely suffering from a viral infection.  2 weeks ago you may have had COVID and you may be getting over symptoms at this time.  Your fevers and abdominal pain may be the first symptoms started to improve. I have sent the following medications to the pharmacy 1.  Benzonatate for cough.  Take this as prescribed.  You may also use Delsym and Robitussin over-the-counter for cough, do not take them for more than 5 days in a row 2. Ibuprofen and tylenol for pain and body aches. Your lab work looks normal and you do not have pneumonia.  Please get in touch with the primary care provider to follow your symptoms and chronic cough.)

## 2023-02-27 ENCOUNTER — Emergency Department (HOSPITAL_BASED_OUTPATIENT_CLINIC_OR_DEPARTMENT_OTHER)
Admission: EM | Admit: 2023-02-27 | Discharge: 2023-02-28 | Disposition: A | Discharge status: Home / Self Care | Payer: Managed Care, Other (non HMO) | Attending: Emergency Medicine | Admitting: Emergency Medicine

## 2023-02-27 ENCOUNTER — Emergency Department (HOSPITAL_COMMUNITY): Payer: Managed Care, Other (non HMO)

## 2023-02-27 ENCOUNTER — Emergency Department (HOSPITAL_BASED_OUTPATIENT_CLINIC_OR_DEPARTMENT_OTHER): Payer: Managed Care, Other (non HMO)

## 2023-02-27 ENCOUNTER — Emergency Department (HOSPITAL_BASED_OUTPATIENT_CLINIC_OR_DEPARTMENT_OTHER): Payer: Managed Care, Other (non HMO) | Admitting: Radiology

## 2023-02-27 ENCOUNTER — Other Ambulatory Visit: Payer: Self-pay

## 2023-02-27 ENCOUNTER — Encounter (HOSPITAL_BASED_OUTPATIENT_CLINIC_OR_DEPARTMENT_OTHER): Payer: Self-pay

## 2023-02-27 DIAGNOSIS — R202 Paresthesia of skin: Secondary | ICD-10-CM | POA: Diagnosis not present

## 2023-02-27 DIAGNOSIS — R531 Weakness: Secondary | ICD-10-CM | POA: Insufficient documentation

## 2023-02-27 DIAGNOSIS — R930 Abnormal findings on diagnostic imaging of skull and head, not elsewhere classified: Secondary | ICD-10-CM | POA: Insufficient documentation

## 2023-02-27 LAB — CBC WITH DIFFERENTIAL/PLATELET
Abs Immature Granulocytes: 0.03 10*3/uL (ref 0.00–0.07)
Basophils Absolute: 0.1 10*3/uL (ref 0.0–0.1)
Basophils Relative: 1 %
Eosinophils Absolute: 0.5 10*3/uL (ref 0.0–0.5)
Eosinophils Relative: 7 %
HCT: 41.7 % (ref 36.0–46.0)
Hemoglobin: 13.6 g/dL (ref 12.0–15.0)
Immature Granulocytes: 0 %
Lymphocytes Relative: 33 %
Lymphs Abs: 2.6 10*3/uL (ref 0.7–4.0)
MCH: 29.5 pg (ref 26.0–34.0)
MCHC: 32.6 g/dL (ref 30.0–36.0)
MCV: 90.5 fL (ref 80.0–100.0)
Monocytes Absolute: 0.5 10*3/uL (ref 0.1–1.0)
Monocytes Relative: 6 %
Neutro Abs: 4 10*3/uL (ref 1.7–7.7)
Neutrophils Relative %: 53 %
Platelets: 357 10*3/uL (ref 150–400)
RBC: 4.61 MIL/uL (ref 3.87–5.11)
RDW: 15.2 % (ref 11.5–15.5)
WBC: 7.7 10*3/uL (ref 4.0–10.5)
nRBC: 0 % (ref 0.0–0.2)

## 2023-02-27 LAB — T4, FREE: Free T4: 0.97 ng/dL (ref 0.61–1.12)

## 2023-02-27 LAB — URINALYSIS, ROUTINE W REFLEX MICROSCOPIC
Bilirubin Urine: NEGATIVE
Glucose, UA: NEGATIVE mg/dL
Hgb urine dipstick: NEGATIVE
Ketones, ur: NEGATIVE mg/dL
Leukocytes,Ua: NEGATIVE
Nitrite: NEGATIVE
Protein, ur: NEGATIVE mg/dL
Specific Gravity, Urine: 1.011 (ref 1.005–1.030)
pH: 5.5 (ref 5.0–8.0)

## 2023-02-27 LAB — COMPREHENSIVE METABOLIC PANEL
ALT: 27 U/L (ref 0–44)
AST: 17 U/L (ref 15–41)
Albumin: 4.6 g/dL (ref 3.5–5.0)
Alkaline Phosphatase: 97 U/L (ref 38–126)
Anion gap: 8 (ref 5–15)
BUN: 13 mg/dL (ref 6–20)
CO2: 28 mmol/L (ref 22–32)
Calcium: 9.7 mg/dL (ref 8.9–10.3)
Chloride: 104 mmol/L (ref 98–111)
Creatinine, Ser: 0.75 mg/dL (ref 0.44–1.00)
GFR, Estimated: >60 mL/min (ref >60)
Glucose, Bld: 85 mg/dL (ref 70–99)
Potassium: 4 mmol/L (ref 3.5–5.1)
Sodium: 140 mmol/L (ref 135–145)
Total Bilirubin: 0.5 mg/dL (ref 0.3–1.2)
Total Protein: 7.4 g/dL (ref 6.5–8.1)

## 2023-02-27 LAB — TROPONIN I (HIGH SENSITIVITY)
Troponin I (High Sensitivity): 3 ng/L (ref <18)
Troponin I (High Sensitivity): 3 ng/L (ref <18)

## 2023-02-27 LAB — CK: Total CK: 58 U/L (ref 38–234)

## 2023-02-27 LAB — TSH: TSH: 0.935 u[IU]/mL (ref 0.350–4.500)

## 2023-02-27 LAB — VITAMIN B12: Vitamin B-12: 236 pg/mL (ref 180–914)

## 2023-02-27 MED ORDER — LORAZEPAM 2 MG/ML IJ SOLN
0.5000 mg | Freq: Once | INTRAMUSCULAR | Status: AC | PRN
  Administered 2023-02-27: 0.5 mg via INTRAVENOUS
  Filled 2023-02-27: qty 1

## 2023-02-27 MED ORDER — LORAZEPAM 2 MG/ML IJ SOLN
1.0000 mg | INTRAMUSCULAR | Status: AC | PRN
  Administered 2023-02-27: 1 mg via INTRAVENOUS
  Filled 2023-02-27: qty 1

## 2023-02-27 NOTE — ED Triage Notes (Addendum)
Obtained via interpreter per request- "I've had bodyaches & numbness- like I can't feel anything- in my body for 2wks already, also burning in LLE 'in my veins,' unsure if related." Pt also states she feels pressure in R kidney when she lays supine  "In June/ July, I was in [place] & I got very sick, had to take abx. A lot of bacteria in my stomach & blood."  Pt also c/o HA for same period of time, blurred vision today  Pt also c/o "pain in this vein- it comes & goes," gesturing over L chest.

## 2023-02-27 NOTE — ED Provider Notes (Signed)
[place] EMERGENCY DEPARTMENT AT [facility] Provider Note   CSN: [id number] Arrival date & time: 02/27/23  1324     History {Add pertinent medical, surgical, social history, OB history to HPI:1} No chief complaint on file.   Vanessa Richmond is a 52 y.o. female.  HPI      52yo female with history of GERD, iron deficiency anemia,  2 week feeling like whole body is numb, dizziness, blurred vision, the veins in the legs burning, dry mouth  2 weeks like this for the first time  At night hands, arms, all over body numb, when up feels like muscles not working.  Lips, face tingling.  All of the body feeling pins and pain in nails, inside nails Blurry vision, not double or missing pieces Constant headaches for 2 weeks No nausea or vomiting In June was in [place] and had bacteria in body, pain with urination and in kidneys and given antibiotics. Vomiting, diarrhea fever  No fever now, did have some pain in chest that was coming and going.   No htn, hlpd, dm Always have pain around the belt-line, right side, iud due to abnormal uterine bleeding.   Dont have a PCP here   Mom, sister both have MS, also they have htn and DM  Takes vitamin c and e, arginine, beet  Low back pain, urinary frequency, does have some burning. Denies retention, denies stool incontinence.  Past Medical History:  Diagnosis Date   GERD (gastroesophageal reflux disease)    Iron deficiency anemia      Home Medications Prior to Admission medications   Medication Sig Start Date End Date Taking? Authorizing Provider  acetaminophen (TYLENOL) 500 MG tablet Take 1 tablet (500 mg total) by mouth every 6 (six) hours as needed. 03/15/22   Redwine, Madison A, PA-C  benzonatate (TESSALON) 100 MG capsule Take 1 capsule (100 mg total) by mouth every 8 (eight) hours. 03/15/22   Redwine, Madison A, PA-C  ferrous sulfate 325 (65 FE) MG tablet Take 1 tablet (325 mg total) by mouth 2 (two) times daily with a  meal. 02/14/20 04/13/20  [place], Alvira Philips, DO  gabapentin (NEURONTIN) 100 MG capsule Take 1 capsule (100 mg total) by mouth 3 (three) times daily. 12/27/20 01/26/21  Alvira Monday, MD  ibuprofen (ADVIL) 800 MG tablet Take 1 tablet (800 mg total) by mouth 3 (three) times daily. 03/15/22   Redwine, Madison A, PA-C  megestrol (MEGACE) 40 MG tablet Take 1 tablet (40 mg total) by mouth daily. 04/06/20   Grayce Sessions, NP  naproxen (NAPROSYN) 500 MG tablet Take 1 tablet (500 mg total) by mouth 2 (two) times daily with a meal. 12/27/20   Alvira Monday, MD  senna (SENOKOT) 8.6 MG TABS tablet Take 1 tablet (8.6 mg total) by mouth daily as needed for mild constipation. 02/25/20   Grayce Sessions, NP      Allergies    Patient has no known allergies.    Review of Systems   Review of Systems  Physical Exam Updated Vital Signs BP 128/81   Pulse 64   Temp 98.2 F (36.8 C)   Resp 19   LMP 05/11/2021   SpO2 100%  Physical Exam  ED Results / Procedures / Treatments   Labs (all labs ordered are listed, but only abnormal results are displayed) Labs Reviewed  CBC WITH DIFFERENTIAL/PLATELET  COMPREHENSIVE METABOLIC PANEL  URINALYSIS, ROUTINE W REFLEX MICROSCOPIC    EKG EKG Interpretation Date/Time:  Monday February 27 2023 13:49:25 EDT Ventricular Rate:  67 PR Interval:  182 QRS Duration:  76 QT Interval:  374 QTC Calculation: 395 R Axis:   13  Text Interpretation: Normal sinus rhythm Anteroseptal infarct , age undetermined Abnormal ECG When compared with ECG of 15-Mar-2022 17:06, No significant change since last tracing Confirmed by Alvira Monday ([id number]) on 02/27/2023 3:25:51 PM  Radiology No results found.  Procedures Procedures  {Document cardiac monitor, telemetry assessment procedure when appropriate:1}  Medications Ordered in ED Medications - No data to display  ED Course/ Medical Decision Making/ A&P   {   Click here for ABCD2, HEART and other  calculatorsREFRESH Note before signing :1}                              Medical Decision Making Amount and/or Complexity of Data Reviewed Labs: ordered. Radiology: ordered.  Risk Prescription drug management.   ***  {Document critical care time when appropriate:1} {Document review of labs and clinical decision tools ie heart score, Chads2Vasc2 etc:1}  {Document your independent review of radiology images, and any outside records:1} {Document your discussion with family members, caretakers, and with consultants:1} {Document social determinants of health affecting pt's care:1} {Document your decision making why or why not admission, treatments were needed:1} Final Clinical Impression(s) / ED Diagnoses Final diagnoses:  None    Rx / DC Orders ED Discharge Orders     None

## 2023-02-27 NOTE — ED Notes (Signed)
Pt arrived to [facility] ED from [facility].

## 2023-02-27 NOTE — ED Provider Notes (Incomplete)
  [place] EMERGENCY DEPARTMENT AT [facility] Provider Note   CSN: [id number] Arrival date & time: 02/27/23  1324     History {Add pertinent medical, surgical, social history, OB history to HPI:1} No chief complaint on file.   Vanessa Richmond is a 52 y.o. female.  HPI        Past Medical History:  Diagnosis Date  . GERD (gastroesophageal reflux disease)   . Iron deficiency anemia      Home Medications Prior to Admission medications   Medication Sig Start Date End Date Taking? Authorizing Provider  acetaminophen (TYLENOL) 500 MG tablet Take 1 tablet (500 mg total) by mouth every 6 (six) hours as needed. 03/15/22   Redwine, Madison A, PA-C  benzonatate (TESSALON) 100 MG capsule Take 1 capsule (100 mg total) by mouth every 8 (eight) hours. 03/15/22   Redwine, Madison A, PA-C  ferrous sulfate 325 (65 FE) MG tablet Take 1 tablet (325 mg total) by mouth 2 (two) times daily with a meal. 02/14/20 04/13/20  [place], Alvira Philips, DO  gabapentin (NEURONTIN) 100 MG capsule Take 1 capsule (100 mg total) by mouth 3 (three) times daily. 12/27/20 01/26/21  Alvira Monday, MD  ibuprofen (ADVIL) 800 MG tablet Take 1 tablet (800 mg total) by mouth 3 (three) times daily. 03/15/22   Redwine, Madison A, PA-C  megestrol (MEGACE) 40 MG tablet Take 1 tablet (40 mg total) by mouth daily. 04/06/20   Grayce Sessions, NP  naproxen (NAPROSYN) 500 MG tablet Take 1 tablet (500 mg total) by mouth 2 (two) times daily with a meal. 12/27/20   Alvira Monday, MD  senna (SENOKOT) 8.6 MG TABS tablet Take 1 tablet (8.6 mg total) by mouth daily as needed for mild constipation. 02/25/20   Grayce Sessions, NP      Allergies    Patient has no known allergies.    Review of Systems   Review of Systems  Physical Exam Updated Vital Signs BP 139/85   Pulse 75   Temp 98.2 F (36.8 C)   Resp (!) 23   LMP 05/11/2021   SpO2 100%  Physical Exam  ED Results / Procedures / Treatments   Labs (all labs  ordered are listed, but only abnormal results are displayed) Labs Reviewed  CBC WITH DIFFERENTIAL/PLATELET  COMPREHENSIVE METABOLIC PANEL  URINALYSIS, ROUTINE W REFLEX MICROSCOPIC    EKG None  Radiology No results found.  Procedures Procedures  {Document cardiac monitor, telemetry assessment procedure when appropriate:1}  Medications Ordered in ED Medications - No data to display  ED Course/ Medical Decision Making/ A&P   {   Click here for ABCD2, HEART and other calculatorsREFRESH Note before signing :1}                              Medical Decision Making Amount and/or Complexity of Data Reviewed Labs: ordered.   ***  {Document critical care time when appropriate:1} {Document review of labs and clinical decision tools ie heart score, Chads2Vasc2 etc:1}  {Document your independent review of radiology images, and any outside records:1} {Document your discussion with family members, caretakers, and with consultants:1} {Document social determinants of health affecting pt's care:1} {Document your decision making why or why not admission, treatments were needed:1} Final Clinical Impression(s) / ED Diagnoses Final diagnoses:  None    Rx / DC Orders ED Discharge Orders     None

## 2023-02-27 NOTE — ED Notes (Signed)
Called [facility] to transport patient to [facility] emergency for MRI--Vanessa Richmond

## 2023-02-28 ENCOUNTER — Telehealth (HOSPITAL_BASED_OUTPATIENT_CLINIC_OR_DEPARTMENT_OTHER): Payer: Self-pay | Admitting: Emergency Medicine

## 2023-02-28 LAB — RPR: RPR Ser Ql: NONREACTIVE

## 2023-02-28 MED ORDER — GADOBUTROL 1 MMOL/ML IV SOLN
8.0000 mL | Freq: Once | INTRAVENOUS | Status: AC | PRN
  Administered 2023-02-28: 8 mL via INTRAVENOUS

## 2023-02-28 NOTE — ED Notes (Signed)
Pt transported to mri 

## 2023-02-28 NOTE — ED Provider Notes (Signed)
I assumed care of this patient from previous provider.  Please see their note for further details of history, exam, and MDM.   Briefly patient is a 52 y.o. female who presented with total body paresthesias.  Labs were completely normal and reassuring.  Sent here for MRI to rule out demyelinating disease given family history.  MRIs of the brain, cervical spine, thoracic spine and lumbar spine were completely negative for evidence of demyelinating disease or stroke.  Patient reports feeling better after Ativan.   The patient appears reasonably screened and/or stabilized for discharge and I doubt any other medical condition or other [facility] requiring further screening, evaluation, or treatment in the ED at this time. I have discussed the findings, Dx and Tx plan with the patient/family who expressed understanding and agree(s) with the plan. Discharge instructions discussed at length. The patient/family was given strict return precautions who verbalized understanding of the instructions. No further questions at time of discharge.  Disposition: Discharge  Condition: Good  ED Discharge Orders     None        Follow Up: Primary care provider  Call  to schedule an appointment for close follow up         Aalani Aikens, Amadeo Garnet, MD 02/28/23 0530

## 2023-03-02 LAB — VITAMIN B1: Vitamin B1 (Thiamine): 100.2 nmol/L (ref 66.5–200.0)

## 2023-05-27 IMAGING — DX DG CHEST 2V
2 series · 2 of 2 positions shown · non-contrast
Comparison: February 12, 2020

CLINICAL DATA: Chest pain.  Back pain.  Abdominal pain.

EXAM:
CHEST - 2 VIEW

[chest pa]
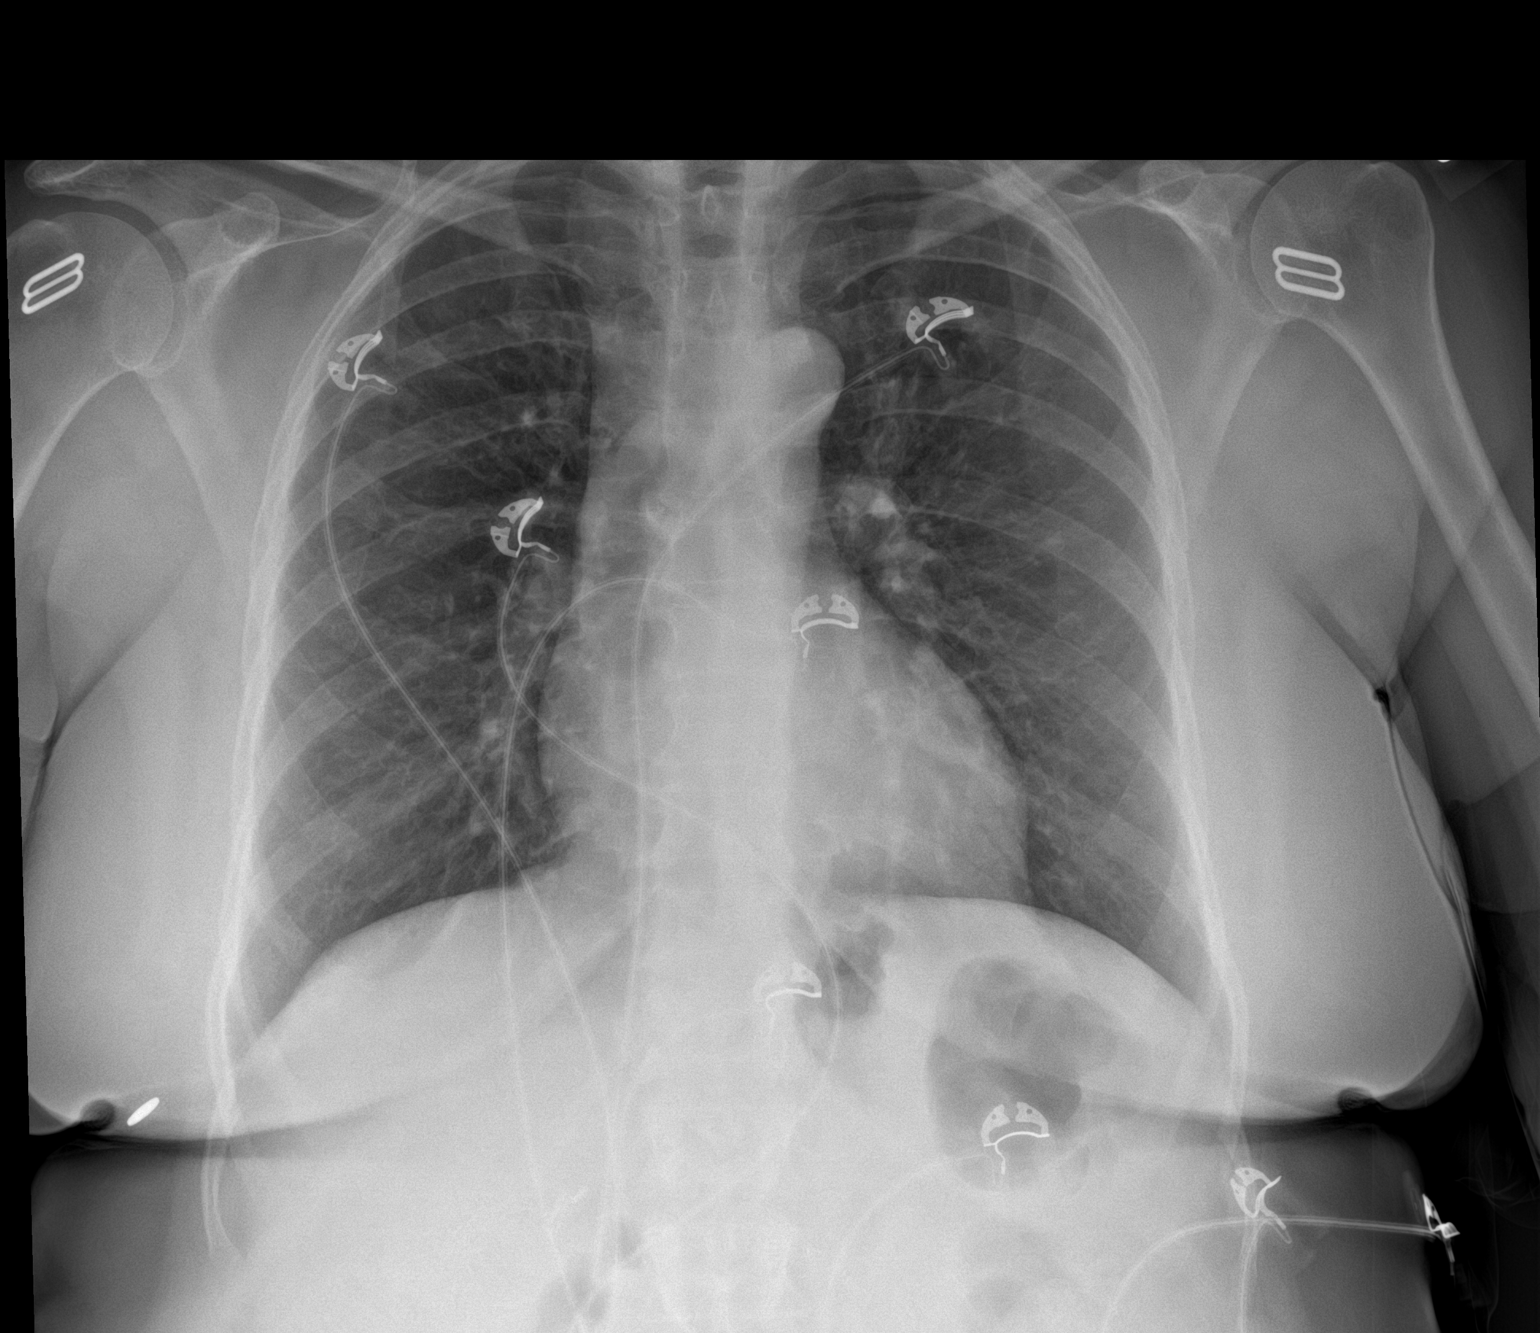

[chest lat]
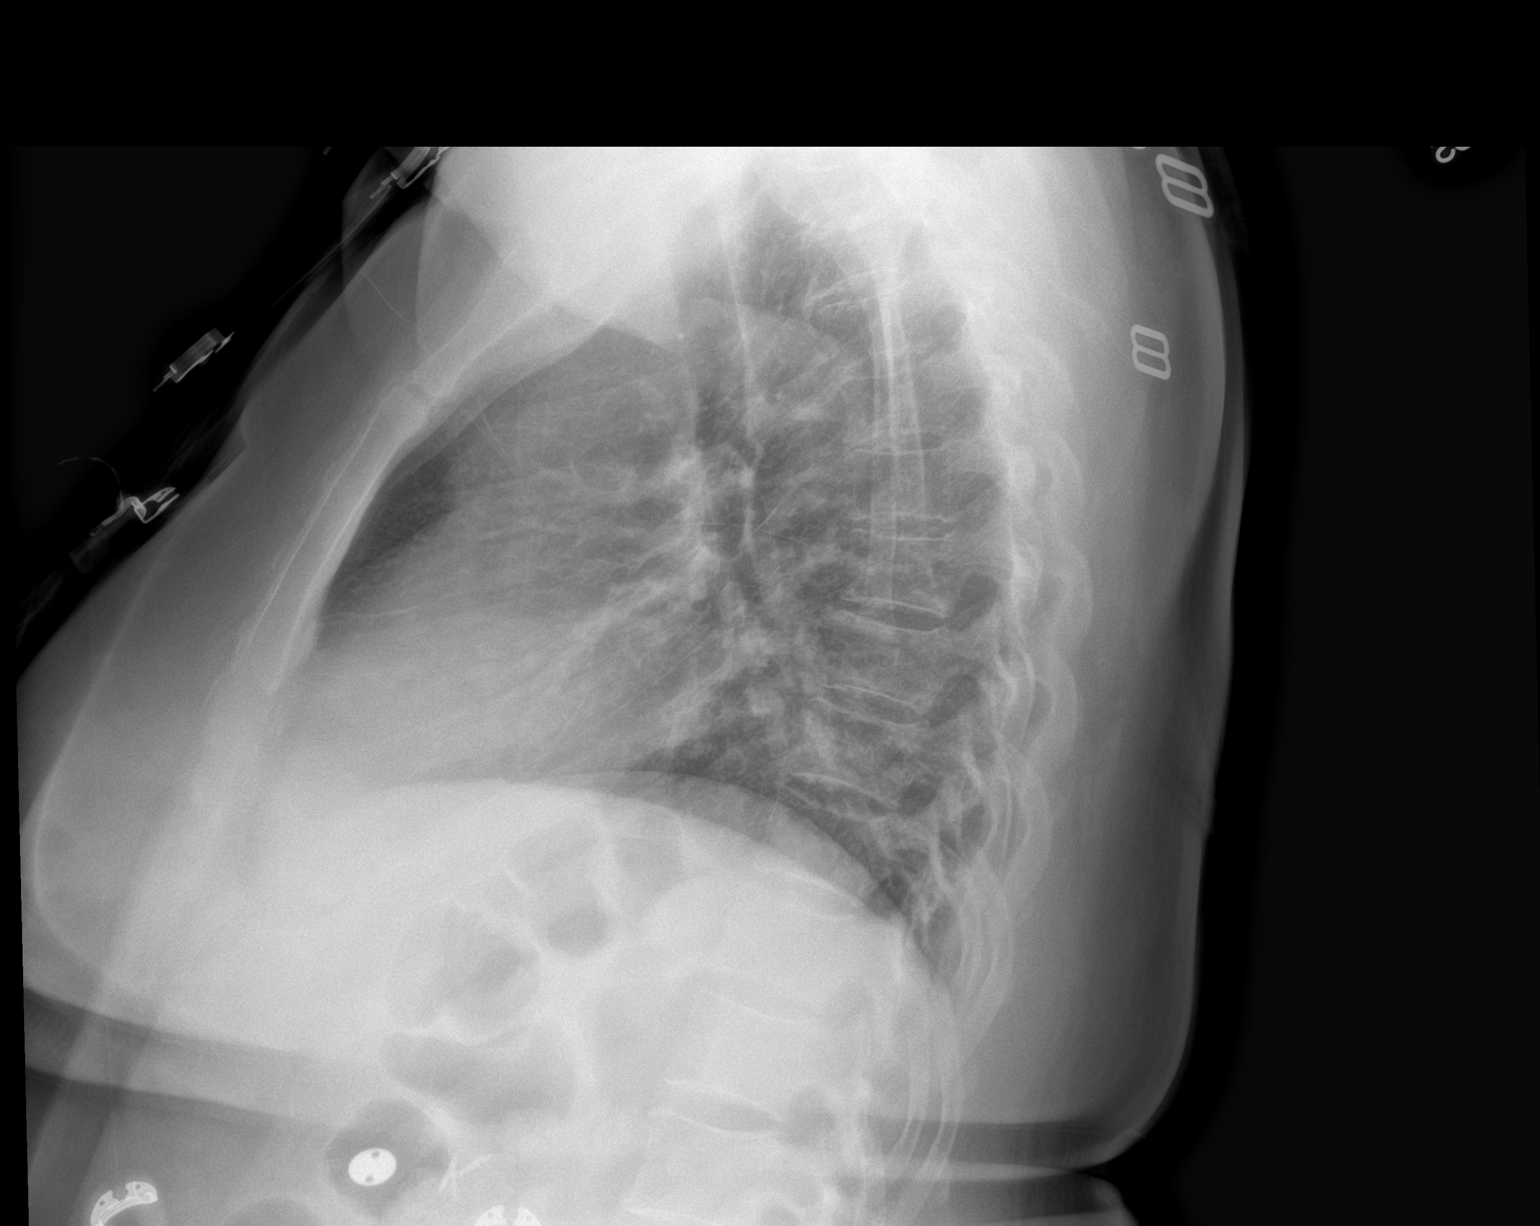

[2 of 2 positions shown; findings below may reference images not displayed]

FINDINGS: The heart size and mediastinal contours are within normal limits.
Both lungs are clear. The visualized skeletal structures are
unremarkable.
IMPRESSION: No active cardiopulmonary disease.

## 2023-12-21 ENCOUNTER — Encounter: Payer: Self-pay | Admitting: Obstetrics and Gynecology

## 2023-12-21 ENCOUNTER — Other Ambulatory Visit (HOSPITAL_COMMUNITY)
Admission: RE | Admit: 2023-12-21 | Discharge: 2023-12-21 | Disposition: A | Discharge status: Home / Self Care | Payer: Self-pay | Source: Ambulatory Visit | Attending: Obstetrics and Gynecology | Admitting: Obstetrics and Gynecology

## 2023-12-21 ENCOUNTER — Ambulatory Visit: Payer: Self-pay | Admitting: Obstetrics and Gynecology

## 2023-12-21 VITALS — BP 130/84 | HR 75 | Ht 60.24 in | Wt 181.0 lb

## 2023-12-21 DIAGNOSIS — G479 Sleep disorder, unspecified: Secondary | ICD-10-CM

## 2023-12-21 DIAGNOSIS — Z30432 Encounter for removal of intrauterine contraceptive device: Secondary | ICD-10-CM

## 2023-12-21 DIAGNOSIS — Z124 Encounter for screening for malignant neoplasm of cervix: Secondary | ICD-10-CM

## 2023-12-21 DIAGNOSIS — F3289 Other specified depressive episodes: Secondary | ICD-10-CM

## 2023-12-21 DIAGNOSIS — Z1231 Encounter for screening mammogram for malignant neoplasm of breast: Secondary | ICD-10-CM

## 2023-12-21 DIAGNOSIS — I8393 Asymptomatic varicose veins of bilateral lower extremities: Secondary | ICD-10-CM

## 2023-12-21 DIAGNOSIS — M545 Low back pain, unspecified: Secondary | ICD-10-CM

## 2023-12-21 DIAGNOSIS — G8929 Other chronic pain: Secondary | ICD-10-CM

## 2023-12-21 NOTE — Progress Notes (Signed)
   GYNECOLOGY PROGRESS NOTE  History:  53 y.o. G3P3003 presents to [facility] for problem visit. She Had IUD placed in 2022  d/t AUB and desires removal. She endorses depression and trouble sleeping. She endorses her son is currently missing and she is very overwhelmed and having hard time coping. Denies SI/HI.   She endorses back pain, leg weakness and tingling. Denies incontinence. Has had an MR of back and brain. She also endorses varicose veins  She has never had a mammogram  The following portions of the patient's history were reviewed and updated as appropriate: allergies, current medications, past family history, past medical history, past social history, past surgical history and problem list. Last pap smear on reports last pap 2022  Health Maintenance Due  Topic Date Due   Hepatitis C Screening  Never done   Hepatitis B Vaccines (1 of 3 - 19+ 3-dose series) Never done   Cervical Cancer Screening (HPV/Pap Cotest)  Never done   Colonoscopy  Never done   MAMMOGRAM  Never done   Zoster Vaccines- Shingrix (1 of 2) Never done   COVID-19 Vaccine (3 - Moderna risk series) 12/06/2019     Review of Systems:  Pertinent items are noted in HPI.   Objective:  Physical Exam Blood pressure 130/84, pulse 75, height 5' 0.24 (1.53 m), weight 181 lb (82.1 kg), last menstrual period 05/11/2021. VS reviewed, nursing note reviewed,  Constitutional: well developed, well nourished, no distress HEENT: normocephalic CV: normal rate Pulm/chest wall: normal effort Abdomen: soft Neuro: alert and oriented  Skin: warm, dry Psych: affect normal, tearful at times Pelvic exam: Pelvic: normal appearing vulva with no masses, tenderness or lesions  VAGINA: normal appearing vagina with normal color and discharge, no lesions  CERVIX: normal appearing cervix without discharge or lesions, no CMT  Thin prep pap is done w HR HPV cotesting  UTERUS: uterus is felt to be normal size, shape, consistency and  nontender   ADNEXA: No adnexal masses or tenderness noted. See IUD removal below  Extremities:  No swelling or varicosities noted   GYNECOLOGY OFFICE PROCEDURE NOTE   IUD Removal  Patient identified, informed consent performed, consent signed.  Patient was in the dorsal lithotomy position, normal external genitalia was noted.  A speculum was placed in the patient's vagina, normal discharge was noted, no lesions. The cervix was visualized, no lesions, no abnormal discharge.  The strings of the IUD were grasped and pulled using ring forceps. The IUD was removed in its entirety.  Patient tolerated the procedure well.    Assessment & Plan:  1. Encounter for IUD removal (Primary) Tolerated well, discussed follow up if bleeding resumes  2. Cervical cancer screening Desire repeat today  - Cytology - PAP( [facility])  3. Encounter for screening mammogram for malignant neoplasm of breast  - MM 3D SCREENING MAMMOGRAM BILATERAL BREAST; Future  4. Varicose veins of both lower extremities, unspecified whether complicated Discussed rest, elevation, compression socks, she will follow up PCP  5. Chronic midline low back pain without sciatica Discussed NSAIDS, ice/heat MR showed mild lumbar degenerative disc disease, discussed PT  6. Other depression 7. Sleep disturbance Discussed options to include counseling, medication, she desires both, discussed initiation and side effect - Ambulatory referral to [facility] - venlafaxine  XR (EFFEXOR  XR) 37.5 MG 24 hr capsule; Take 1 capsule (37.5 mg total) by mouth daily with breakfast.  Dispense: 30 capsule; Refill: 8   Nidia Daring, FNP

## 2023-12-21 NOTE — Progress Notes (Signed)
 NGYN presents to establish care. Pt has Liletta  IUD for bleeding and wants it removed today. Inserted 03/2021.   Last PAP 2 years ago  No mammogram or colonoscopy  Declines STD testing   Pt also c/o dizziness, numbness all over, and weakness in the legs and feels she may have a blood clot.

## 2023-12-22 MED ORDER — VENLAFAXINE HCL ER 37.5 MG PO CP24
37.5000 mg | ORAL_CAPSULE | Freq: Every day | ORAL | 8 refills | Status: AC
Start: 2023-12-22 — End: ?

## 2023-12-29 LAB — CYTOLOGY - PAP
Comment: NEGATIVE
Diagnosis: UNDETERMINED — AB
High risk HPV: NEGATIVE

## 2024-01-01 ENCOUNTER — Ambulatory Visit: Payer: Self-pay | Admitting: Obstetrics and Gynecology
